# Patient Record
Sex: Female | Born: 1935 | Race: White | Hispanic: No | Smoking: Never smoker
Health system: Southern US, Community
[De-identification: ages and names within clinical notes are randomized; demographics above are authoritative.]

## PROBLEM LIST (undated history)

## (undated) DIAGNOSIS — F419 Anxiety disorder, unspecified: Secondary | ICD-10-CM

## (undated) DIAGNOSIS — F329 Major depressive disorder, single episode, unspecified: Secondary | ICD-10-CM

## (undated) DIAGNOSIS — Z87448 Personal history of other diseases of urinary system: Secondary | ICD-10-CM

## (undated) DIAGNOSIS — K635 Polyp of colon: Secondary | ICD-10-CM

## (undated) DIAGNOSIS — Z974 Presence of external hearing-aid: Secondary | ICD-10-CM

## (undated) DIAGNOSIS — M19041 Primary osteoarthritis, right hand: Secondary | ICD-10-CM

## (undated) DIAGNOSIS — K219 Gastro-esophageal reflux disease without esophagitis: Secondary | ICD-10-CM

## (undated) DIAGNOSIS — E785 Hyperlipidemia, unspecified: Secondary | ICD-10-CM

## (undated) DIAGNOSIS — M810 Age-related osteoporosis without current pathological fracture: Secondary | ICD-10-CM

## (undated) DIAGNOSIS — N189 Chronic kidney disease, unspecified: Secondary | ICD-10-CM

## (undated) DIAGNOSIS — Z66 Do not resuscitate: Secondary | ICD-10-CM

## (undated) DIAGNOSIS — D649 Anemia, unspecified: Secondary | ICD-10-CM

## (undated) DIAGNOSIS — N3281 Overactive bladder: Secondary | ICD-10-CM

## (undated) DIAGNOSIS — M1611 Unilateral primary osteoarthritis, right hip: Secondary | ICD-10-CM

## (undated) DIAGNOSIS — R32 Unspecified urinary incontinence: Secondary | ICD-10-CM

## (undated) DIAGNOSIS — N952 Postmenopausal atrophic vaginitis: Secondary | ICD-10-CM

## (undated) DIAGNOSIS — M8580 Other specified disorders of bone density and structure, unspecified site: Secondary | ICD-10-CM

## (undated) DIAGNOSIS — F32A Depression, unspecified: Secondary | ICD-10-CM

## (undated) DIAGNOSIS — M19042 Primary osteoarthritis, left hand: Secondary | ICD-10-CM

## (undated) DIAGNOSIS — H25019 Cortical age-related cataract, unspecified eye: Secondary | ICD-10-CM

## (undated) DIAGNOSIS — K5900 Constipation, unspecified: Secondary | ICD-10-CM

## (undated) DIAGNOSIS — N393 Stress incontinence (female) (male): Secondary | ICD-10-CM

## (undated) HISTORY — DX: Major depressive disorder, single episode, unspecified: F32.9

## (undated) HISTORY — DX: Unspecified urinary incontinence: R32

## (undated) HISTORY — DX: Overactive bladder: N32.81

## (undated) HISTORY — PX: COLONOSCOPY: SHX174

## (undated) HISTORY — DX: Constipation, unspecified: K59.00

## (undated) HISTORY — DX: Cortical age-related cataract, unspecified eye: H25.019

## (undated) HISTORY — PX: FLEXIBLE SIGMOIDOSCOPY: SHX1649

## (undated) HISTORY — DX: Hyperlipidemia, unspecified: E78.5

## (undated) HISTORY — DX: Anemia, unspecified: D64.9

## (undated) HISTORY — DX: Depression, unspecified: F32.A

## (undated) HISTORY — DX: Personal history of other diseases of urinary system: Z87.448

## (undated) HISTORY — DX: Gastro-esophageal reflux disease without esophagitis: K21.9

## (undated) HISTORY — DX: Stress incontinence (female) (male): N39.3

## (undated) HISTORY — DX: Anxiety disorder, unspecified: F41.9

## (undated) HISTORY — DX: Primary osteoarthritis, left hand: M19.042

## (undated) HISTORY — DX: Other specified disorders of bone density and structure, unspecified site: M85.80

## (undated) HISTORY — DX: Polyp of colon: K63.5

## (undated) HISTORY — DX: Postmenopausal atrophic vaginitis: N95.2

## (undated) HISTORY — PX: CATARACT EXTRACTION: SUR2

## (undated) HISTORY — DX: Primary osteoarthritis, right hand: M19.041

## (undated) HISTORY — DX: Chronic kidney disease, unspecified: N18.9

## (undated) HISTORY — PX: ESOPHAGOGASTRODUODENOSCOPY: SHX1529

## (undated) HISTORY — PX: TONSILLECTOMY: SUR1361

## (undated) HISTORY — DX: Age-related osteoporosis without current pathological fracture: M81.0

## (undated) HISTORY — DX: Hemochromatosis, unspecified: E83.119

---

## 2004-07-01 ENCOUNTER — Ambulatory Visit: Payer: Self-pay | Admitting: Oncology

## 2004-08-01 ENCOUNTER — Ambulatory Visit: Payer: Self-pay | Admitting: Oncology

## 2004-09-08 ENCOUNTER — Ambulatory Visit: Payer: Self-pay | Admitting: Oncology

## 2004-09-20 ENCOUNTER — Ambulatory Visit: Payer: Self-pay | Admitting: Unknown Physician Specialty

## 2004-10-01 ENCOUNTER — Ambulatory Visit: Payer: Self-pay | Admitting: Oncology

## 2004-11-01 ENCOUNTER — Ambulatory Visit: Payer: Self-pay | Admitting: Oncology

## 2004-11-29 ENCOUNTER — Ambulatory Visit: Payer: Self-pay | Admitting: Oncology

## 2005-01-08 ENCOUNTER — Ambulatory Visit: Payer: Self-pay | Admitting: Oncology

## 2005-01-29 ENCOUNTER — Ambulatory Visit: Payer: Self-pay | Admitting: Oncology

## 2005-03-05 ENCOUNTER — Ambulatory Visit: Payer: Self-pay | Admitting: Oncology

## 2005-03-31 ENCOUNTER — Ambulatory Visit: Payer: Self-pay | Admitting: Oncology

## 2005-06-27 ENCOUNTER — Ambulatory Visit: Payer: Self-pay | Admitting: Oncology

## 2005-07-01 ENCOUNTER — Ambulatory Visit: Payer: Self-pay | Admitting: Oncology

## 2005-08-01 ENCOUNTER — Ambulatory Visit: Payer: Self-pay | Admitting: Oncology

## 2005-09-04 ENCOUNTER — Ambulatory Visit: Payer: Self-pay | Admitting: Oncology

## 2005-09-27 ENCOUNTER — Ambulatory Visit: Payer: Self-pay | Admitting: Unknown Physician Specialty

## 2005-10-01 ENCOUNTER — Ambulatory Visit: Payer: Self-pay | Admitting: Oncology

## 2005-11-01 ENCOUNTER — Ambulatory Visit: Payer: Self-pay | Admitting: Oncology

## 2005-12-05 ENCOUNTER — Ambulatory Visit: Payer: Self-pay | Admitting: Oncology

## 2005-12-30 ENCOUNTER — Ambulatory Visit: Payer: Self-pay | Admitting: Oncology

## 2006-01-29 ENCOUNTER — Ambulatory Visit: Payer: Self-pay | Admitting: Oncology

## 2006-03-12 ENCOUNTER — Ambulatory Visit: Payer: Self-pay | Admitting: Oncology

## 2006-04-01 ENCOUNTER — Ambulatory Visit: Payer: Self-pay | Admitting: Surgery

## 2006-04-02 ENCOUNTER — Ambulatory Visit: Payer: Self-pay | Admitting: Oncology

## 2006-05-01 ENCOUNTER — Ambulatory Visit: Payer: Self-pay | Admitting: Oncology

## 2006-06-01 ENCOUNTER — Ambulatory Visit: Payer: Self-pay | Admitting: Oncology

## 2006-07-01 ENCOUNTER — Ambulatory Visit: Payer: Self-pay | Admitting: Oncology

## 2006-08-02 ENCOUNTER — Ambulatory Visit: Payer: Self-pay | Admitting: Oncology

## 2006-09-30 ENCOUNTER — Ambulatory Visit: Payer: Self-pay | Admitting: Unknown Physician Specialty

## 2006-09-30 ENCOUNTER — Ambulatory Visit: Payer: Self-pay | Admitting: Oncology

## 2006-10-01 ENCOUNTER — Ambulatory Visit: Payer: Self-pay | Admitting: Oncology

## 2006-11-01 ENCOUNTER — Ambulatory Visit: Payer: Self-pay | Admitting: Oncology

## 2006-11-30 ENCOUNTER — Ambulatory Visit: Payer: Self-pay | Admitting: Oncology

## 2006-12-31 ENCOUNTER — Ambulatory Visit: Payer: Self-pay | Admitting: Oncology

## 2007-01-30 ENCOUNTER — Ambulatory Visit: Payer: Self-pay | Admitting: Oncology

## 2007-03-02 ENCOUNTER — Ambulatory Visit: Payer: Self-pay | Admitting: Oncology

## 2007-04-01 ENCOUNTER — Ambulatory Visit: Payer: Self-pay | Admitting: Oncology

## 2007-05-02 ENCOUNTER — Ambulatory Visit: Payer: Self-pay | Admitting: Oncology

## 2007-05-14 ENCOUNTER — Ambulatory Visit: Payer: Self-pay | Admitting: Oncology

## 2007-05-20 ENCOUNTER — Ambulatory Visit: Payer: Self-pay | Admitting: Ophthalmology

## 2007-06-02 ENCOUNTER — Ambulatory Visit: Payer: Self-pay | Admitting: Oncology

## 2007-07-02 ENCOUNTER — Ambulatory Visit: Payer: Self-pay | Admitting: Oncology

## 2007-07-07 ENCOUNTER — Ambulatory Visit: Payer: Self-pay | Admitting: Oncology

## 2007-07-08 ENCOUNTER — Ambulatory Visit: Payer: Self-pay | Admitting: Ophthalmology

## 2007-08-02 ENCOUNTER — Ambulatory Visit: Payer: Self-pay | Admitting: Oncology

## 2007-09-01 ENCOUNTER — Ambulatory Visit: Payer: Self-pay | Admitting: Oncology

## 2007-10-02 ENCOUNTER — Ambulatory Visit: Payer: Self-pay | Admitting: Oncology

## 2007-10-21 ENCOUNTER — Ambulatory Visit: Payer: Self-pay | Admitting: Unknown Physician Specialty

## 2007-10-24 ENCOUNTER — Ambulatory Visit: Payer: Self-pay | Admitting: Oncology

## 2007-11-02 ENCOUNTER — Ambulatory Visit: Payer: Self-pay | Admitting: Internal Medicine

## 2007-11-02 ENCOUNTER — Ambulatory Visit: Payer: Self-pay | Admitting: Oncology

## 2007-11-30 ENCOUNTER — Ambulatory Visit: Payer: Self-pay | Admitting: Internal Medicine

## 2007-11-30 ENCOUNTER — Ambulatory Visit: Payer: Self-pay | Admitting: Oncology

## 2007-12-31 ENCOUNTER — Ambulatory Visit: Payer: Self-pay | Admitting: Internal Medicine

## 2008-01-30 ENCOUNTER — Ambulatory Visit: Payer: Self-pay | Admitting: Internal Medicine

## 2008-03-01 ENCOUNTER — Ambulatory Visit: Payer: Self-pay | Admitting: Internal Medicine

## 2008-03-31 ENCOUNTER — Ambulatory Visit: Payer: Self-pay | Admitting: Internal Medicine

## 2008-05-01 ENCOUNTER — Ambulatory Visit: Payer: Self-pay | Admitting: Internal Medicine

## 2008-05-04 ENCOUNTER — Ambulatory Visit: Payer: Self-pay | Admitting: Internal Medicine

## 2008-06-01 ENCOUNTER — Ambulatory Visit: Payer: Self-pay | Admitting: Internal Medicine

## 2008-07-01 ENCOUNTER — Ambulatory Visit: Payer: Self-pay | Admitting: Internal Medicine

## 2008-08-01 ENCOUNTER — Ambulatory Visit: Payer: Self-pay | Admitting: Internal Medicine

## 2008-08-31 ENCOUNTER — Ambulatory Visit: Payer: Self-pay | Admitting: Internal Medicine

## 2008-09-07 ENCOUNTER — Ambulatory Visit: Payer: Self-pay | Admitting: Internal Medicine

## 2008-10-01 ENCOUNTER — Ambulatory Visit: Payer: Self-pay | Admitting: Internal Medicine

## 2008-10-21 ENCOUNTER — Ambulatory Visit: Payer: Self-pay | Admitting: Internal Medicine

## 2008-11-01 ENCOUNTER — Ambulatory Visit: Payer: Self-pay | Admitting: Internal Medicine

## 2008-11-30 ENCOUNTER — Ambulatory Visit: Payer: Self-pay | Admitting: Internal Medicine

## 2008-12-02 ENCOUNTER — Ambulatory Visit: Payer: Self-pay | Admitting: Internal Medicine

## 2008-12-30 ENCOUNTER — Ambulatory Visit: Payer: Self-pay | Admitting: Internal Medicine

## 2009-01-29 ENCOUNTER — Ambulatory Visit: Payer: Self-pay | Admitting: Internal Medicine

## 2009-02-08 ENCOUNTER — Ambulatory Visit: Payer: Self-pay | Admitting: Unknown Physician Specialty

## 2009-02-24 ENCOUNTER — Ambulatory Visit: Payer: Self-pay | Admitting: Internal Medicine

## 2009-03-01 ENCOUNTER — Ambulatory Visit: Payer: Self-pay | Admitting: Internal Medicine

## 2009-05-01 ENCOUNTER — Ambulatory Visit: Payer: Self-pay | Admitting: Internal Medicine

## 2009-05-18 ENCOUNTER — Ambulatory Visit: Payer: Self-pay | Admitting: Internal Medicine

## 2009-06-01 ENCOUNTER — Ambulatory Visit: Payer: Self-pay | Admitting: Internal Medicine

## 2009-08-01 ENCOUNTER — Ambulatory Visit: Payer: Self-pay | Admitting: Internal Medicine

## 2009-08-08 ENCOUNTER — Ambulatory Visit: Payer: Self-pay | Admitting: Internal Medicine

## 2009-08-31 ENCOUNTER — Ambulatory Visit: Payer: Self-pay | Admitting: Internal Medicine

## 2009-10-25 ENCOUNTER — Ambulatory Visit: Payer: Self-pay | Admitting: Unknown Physician Specialty

## 2009-10-31 ENCOUNTER — Ambulatory Visit: Payer: Self-pay | Admitting: Internal Medicine

## 2009-11-01 ENCOUNTER — Ambulatory Visit: Payer: Self-pay | Admitting: Internal Medicine

## 2009-11-29 ENCOUNTER — Ambulatory Visit: Payer: Self-pay | Admitting: Internal Medicine

## 2009-12-30 ENCOUNTER — Ambulatory Visit: Payer: Self-pay | Admitting: Internal Medicine

## 2010-01-23 ENCOUNTER — Ambulatory Visit: Payer: Self-pay | Admitting: Internal Medicine

## 2010-01-29 ENCOUNTER — Ambulatory Visit: Payer: Self-pay | Admitting: Internal Medicine

## 2010-03-31 ENCOUNTER — Ambulatory Visit: Payer: Self-pay | Admitting: Internal Medicine

## 2010-04-17 ENCOUNTER — Ambulatory Visit: Payer: Self-pay | Admitting: Internal Medicine

## 2010-05-01 ENCOUNTER — Ambulatory Visit: Payer: Self-pay | Admitting: Internal Medicine

## 2010-06-22 ENCOUNTER — Ambulatory Visit: Payer: Self-pay | Admitting: Dermatology

## 2010-07-28 ENCOUNTER — Ambulatory Visit: Payer: Self-pay | Admitting: Internal Medicine

## 2010-08-01 ENCOUNTER — Ambulatory Visit: Payer: Self-pay | Admitting: Internal Medicine

## 2010-08-31 ENCOUNTER — Ambulatory Visit: Payer: Self-pay | Admitting: Internal Medicine

## 2010-10-23 ENCOUNTER — Ambulatory Visit: Payer: Self-pay | Admitting: Internal Medicine

## 2010-10-27 ENCOUNTER — Ambulatory Visit: Payer: Self-pay | Admitting: Unknown Physician Specialty

## 2010-11-01 ENCOUNTER — Ambulatory Visit: Payer: Self-pay | Admitting: Internal Medicine

## 2010-11-29 ENCOUNTER — Encounter: Payer: Self-pay | Admitting: Family Medicine

## 2010-11-29 ENCOUNTER — Ambulatory Visit (INDEPENDENT_AMBULATORY_CARE_PROVIDER_SITE_OTHER): Payer: Self-pay | Admitting: Family Medicine

## 2010-11-29 DIAGNOSIS — M810 Age-related osteoporosis without current pathological fracture: Secondary | ICD-10-CM | POA: Insufficient documentation

## 2010-11-29 DIAGNOSIS — Z862 Personal history of diseases of the blood and blood-forming organs and certain disorders involving the immune mechanism: Secondary | ICD-10-CM | POA: Insufficient documentation

## 2010-11-29 DIAGNOSIS — J309 Allergic rhinitis, unspecified: Secondary | ICD-10-CM

## 2010-12-07 NOTE — Assessment & Plan Note (Signed)
Summary: EAR FILL STUFFY   Vital Signs:  Patient Profile:   75 Years Old Female CC:      ears feel full O2 Sat:      99 % O2 treatment:    Room Air Temp:     97.7 degrees F oral Pulse rate:   68 / minute Pulse rhythm:   regular Resp:     12 per minute BP sitting:   135 / 77  (right arm)  Pt. in pain?   no                   Current Allergies (reviewed today): ! MACROBIDHistory of Present Illness History from: patient Chief Complaint: ears feel full History of Present Illness: The patient presented today with 3 to 5 days of a sensation of a fullness in the ears.  Nasal congestion reported.  No fever or chills.  No n/v/d.  NO headaches but has been concerned about slightly decreased hearing.  Pt already wears bilateral hearing aids.  Minimal rare sneezing reported.   REVIEW OF SYSTEMS Constitutional Symptoms      Denies fever, chills, night sweats, weight loss, weight gain, and fatigue.  Eyes       Denies change in vision, eye pain, eye discharge, glasses, contact lenses, and eye surgery. Ear/Nose/Throat/Mouth       Complains of change in hearing.      Denies hearing loss/aids, ear pain, ear discharge, dizziness, frequent runny nose, frequent nose bleeds, sinus problems, sore throat, hoarseness, and tooth pain or bleeding.  Respiratory       Denies dry cough, productive cough, wheezing, shortness of breath, asthma, bronchitis, and emphysema/COPD.  Cardiovascular       Denies murmurs, chest pain, and tires easily with exhertion.    Gastrointestinal       Denies stomach pain, nausea/vomiting, diarrhea, constipation, blood in bowel movements, and indigestion. Genitourniary       Denies painful urination, kidney stones, and loss of urinary control. Neurological       Denies paralysis, seizures, and fainting/blackouts. Musculoskeletal       Denies muscle pain, joint pain, joint stiffness, decreased range of motion, redness, swelling, muscle weakness, and gout.  Skin  Denies bruising, unusual mles/lumps or sores, and hair/skin or nail changes.  Psych       Denies mood changes, temper/anger issues, anxiety/stress, speech problems, depression, and sleep problems.  Past History:  Family History: Last updated: 11/29/2010 Healthy per patient  Social History: Last updated: 11/29/2010 Wine with dinner No Tobacco, No Recreational Drugs Retired  Past Medical History: Hemochromatosis Osteoporosis Allergic Rhinitis  Past Surgical History: Denies surgical history  Family History: Healthy per patient  Social History: Wine with dinner No Tobacco, No Recreational Drugs Retired Physical Exam General appearance: well developed, well nourished, no acute distress Head: normocephalic, atraumatic Eyes: conjunctivae and lids normal Pupils: equal, round, reactive to light Ears: normal, no lesions or deformities Nasal: pale and boggy swollen nasal mucosa Oral/Pharynx: tongue normal, posterior pharynx without erythema or exudate Neck: neck supple,  trachea midline, no masses Heart: regular rate and  rhythm, no murmur Extremities: normal extremities Neurological: grossly intact and non-focal Skin: no obvious rashes or lesions MSE: oriented to time, place, and person Assessment New Problems: ALLERGIC RHINITIS CAUSE UNSPECIFIED (ICD-477.9) UNSPECIFIED OSTEOPOROSIS (ICD-733.00) HEMOCHROMATOSIS, HX OF (ICD-V12.3)   Patient Education: Patient and/or caregiver instructed in the following: rest. The risks, benefits and possible side effects were clearly explained and discussed with the patient.  The patient verbalized clear understanding.  The patient was given instructions to return if symptoms don't improve, worsen or new changes develop.  If it is not during clinic hours and the patient cannot get back to this clinic then the patient was told to seek medical care at an available urgent care or emergency department.  The patient verbalized understanding.     Demonstrates willingness to comply.  Plan Follow Up: Follow up on an as needed basis, Follow up with Primary Physician  The patient and/or caregiver has been counseled thoroughly with regard to medications prescribed including dosage, schedule, interactions, rationale for use, and possible side effects and they verbalize understanding.  Diagnoses and expected course of recovery discussed and will return if not improved as expected or if the condition worsens. Patient and/or caregiver verbalized understanding.   Patient Instructions: 1)  The patient was informed that there is no on-call provider or services available at this clinic during off-hours (when the clinic is closed).  If the patient developed a problem or concern that required immediate attention, the patient was advised to go the the nearest available urgent care or emergency department for medical care.  The patient verbalized understanding.    2)  Return or go to the ER if no improvement or symptoms getting worse.

## 2010-12-16 ENCOUNTER — Encounter: Payer: Self-pay | Admitting: Family Medicine

## 2010-12-19 NOTE — Letter (Signed)
Summary: medication list  medication list   Imported By: Rosine Beat 12/16/2010 16:45:42  _____________________________________________________________________  External Attachment:    Type:   Image     Comment:   External Document

## 2010-12-21 ENCOUNTER — Ambulatory Visit (INDEPENDENT_AMBULATORY_CARE_PROVIDER_SITE_OTHER): Payer: Medicare Other | Admitting: Family Medicine

## 2010-12-21 ENCOUNTER — Encounter: Payer: Self-pay | Admitting: Family Medicine

## 2010-12-21 DIAGNOSIS — J309 Allergic rhinitis, unspecified: Secondary | ICD-10-CM

## 2010-12-21 DIAGNOSIS — J019 Acute sinusitis, unspecified: Secondary | ICD-10-CM

## 2010-12-28 NOTE — Miscellaneous (Signed)
Summary: Written Rxs for 12/21/10  Clinical Lists Changes  Medications: Added new medication of CETIRIZINE HCL 10 MG TABS (CETIRIZINE HCL) take 1 by mouth daily for allergies - Signed Added new medication of AZITHROMYCIN 250 MG TABS (AZITHROMYCIN) take as directed - Signed Rx of CETIRIZINE HCL 10 MG TABS (CETIRIZINE HCL) take 1 by mouth daily for allergies;  #30 x 1;  Signed;  Entered by: Standley Dakins MD;  Authorized by: Standley Dakins MD;  Method used: Handwritten Rx of AZITHROMYCIN 250 MG TABS (AZITHROMYCIN) take as directed;  #6 x 0;  Signed;  Entered by: Standley Dakins MD;  Authorized by: Standley Dakins MD;  Method used: Handwritten    Prescriptions: AZITHROMYCIN 250 MG TABS (AZITHROMYCIN) take as directed  #6 x 0   Entered and Authorized by:   Standley Dakins MD   Signed by:   Standley Dakins MD on 12/21/2010   Method used:   Handwritten   RxID:   [id number] CETIRIZINE HCL 10 MG TABS (CETIRIZINE HCL) take 1 by mouth daily for allergies  #30 x 1   Entered and Authorized by:   Standley Dakins MD   Signed by:   Standley Dakins MD on 12/21/2010   Method used:   Handwritten   RxID:   [id number]  I called the pharmacist to check about patient taking azithromycin.  They said they had in her system that she had no allergies.  I told them that she may have reaction to macrobid.  I called patient and she said that she had some itching in the eyes when she took macrobid a long time ago.  I told her that if she had any recurrent symptoms on the zithromax to please stop the medicine and to let us know and we would need to change the medication.  I told patient to go to ER if she had any swelling of the lips, face, etc.  The patient verbalized clear understanding.  Rodney Langton, MD, CDE, [facility]

## 2010-12-28 NOTE — Assessment & Plan Note (Signed)
Summary: ALLERGIES   Vital Signs:  Patient Profile:   75 Years Old Female CC:      Sinus Drainage O2 Sat:      99 % O2 treatment:    Room Air Temp:     97.9 degrees F oral Pulse rate:   70 / minute Pulse rhythm:   regular Resp:     13 per minute BP sitting:   141 / 84  (left arm)  Pt. in pain?   no                   Current Allergies (reviewed today): ! MACROBIDHistory of Present Illness History from: patient Chief Complaint: Sinus Drainage History of Present Illness: The patient reported that she is still having a lot of sinus postnasal drainage and dental pain and coughing up yellow mucus.  She is having some thick yellow drainage from the nose as well.  She had stopped using flonase nasal spray after about 1 week.  She is using some OTC decongestant with acetaminophen now but still having some symptoms especially after spending time outside among the blooming cherry trees, etc.  She was concerned about a possible sinus infection.  No fever or chills reported.  No n/v/d.  No SOB.  Occasional cough only.    REVIEW OF SYSTEMS Constitutional Symptoms      Denies fever, chills, night sweats, weight loss, weight gain, and fatigue.  Eyes       Denies change in vision, eye pain, eye discharge, glasses, contact lenses, and eye surgery. Ear/Nose/Throat/Mouth       Complains of frequent runny nose, sinus problems, and tooth pain or bleeding.      Denies hearing loss/aids, change in hearing, ear pain, ear discharge, dizziness, frequent nose bleeds, sore throat, and hoarseness.  Respiratory       Denies dry cough, productive cough, wheezing, shortness of breath, asthma, bronchitis, and emphysema/COPD.  Cardiovascular       Denies murmurs, chest pain, and tires easily with exhertion.    Gastrointestinal       Denies stomach pain, nausea/vomiting, diarrhea, constipation, blood in bowel movements, and indigestion. Genitourniary       Denies painful urination, kidney stones, and loss of  urinary control. Neurological       Denies paralysis, seizures, and fainting/blackouts. Musculoskeletal       Denies muscle pain, joint pain, joint stiffness, decreased range of motion, redness, swelling, muscle weakness, and gout.  Skin       Denies bruising, unusual mles/lumps or sores, and hair/skin or nail changes.  Psych       Denies mood changes, temper/anger issues, anxiety/stress, speech problems, depression, and sleep problems.  Past History:  Family History: Last updated: 11/29/2010 Healthy per patient  Social History: Last updated: 11/29/2010 Wine with dinner No Tobacco, No Recreational Drugs Retired  Past Medical History: Reviewed history from 11/29/2010 and no changes required. Hemochromatosis Osteoporosis Allergic Rhinitis  Past Surgical History: Reviewed history from 11/29/2010 and no changes required. Denies surgical history  Family History: Reviewed history from 11/29/2010 and no changes required. Healthy per patient  Social History: Reviewed history from 11/29/2010 and no changes required. Wine with dinner No Tobacco, No Recreational Drugs Retired Physical Exam General appearance: well developed, well nourished, no acute distress Head: normocephalic, atraumatic Eyes: conjunctivae and lids normal Pupils: equal, round, reactive to light Ears: normal, no lesions or deformities Nasal: swollen red turbinates with congestion Oral/Pharynx: tongue normal, posterior pharynx without erythema or exudate  Neck: neck supple,  trachea midline, no masses Chest/Lungs: no rales, wheezes, or rhonchi bilateral, breath sounds equal without effort Heart: regular rate and  rhythm, no murmur Extremities: normal extremities Neurological: grossly intact and non-focal Skin: no obvious rashes or lesions MSE: oriented to time, place, and person Assessment New Problems: ACUTE SINUSITIS, UNSPECIFIED (ICD-461.9)   Patient Education: Patient and/or caregiver instructed  in the following: rest. The risks, benefits and possible side effects were clearly explained and discussed with the patient.  The patient verbalized clear understanding.  The patient was given instructions to return if symptoms don't improve, worsen or new changes develop.  If it is not during clinic hours and the patient cannot get back to this clinic then the patient was told to seek medical care at an available urgent care or emergency department.  The patient verbalized understanding.   Demonstrates willingness to comply.  Plan Follow Up: Follow up in 2-3 days if no improvement, Follow up on an as needed basis, Follow up with Primary Physician  The patient and/or caregiver has been counseled thoroughly with regard to medications prescribed including dosage, schedule, interactions, rationale for use, and possible side effects and they verbalize understanding.  Diagnoses and expected course of recovery discussed and will return if not improved as expected or if the condition worsens. Patient and/or caregiver verbalized understanding.   Patient Instructions: 1)  Go to the pharmacy and pick up your prescription (s).  2)  Take your antibiotic as prescribed until ALL of it is gone, but stop if you develop a rash or swelling and contact our office as soon as possible. 3)  Acute sinusitis symptoms for less than 10 days are not helped by antibiotics.Use warm moist compresses, and over the counter decongestants ( only as directed). Call if no improvement in 5-7 days, sooner if increasing pain, fever, or new symptoms. 4)  Start Zyrtec (Cetirizine) daily to alleviate allergy symptoms.  Take 1 at night.  You can use this along with flonase nasal spray.  5)  Return or go to the ER if no improvement or symptoms getting worse.

## 2011-01-02 NOTE — Letter (Signed)
Summary: History Form  History Form   Imported By: Eugenio Hoes 12/25/2010 10:09:18  _____________________________________________________________________  External Attachment:    Type:   Image     Comment:   External Document

## 2011-01-12 ENCOUNTER — Ambulatory Visit: Payer: Self-pay | Admitting: Internal Medicine

## 2011-01-30 ENCOUNTER — Ambulatory Visit: Payer: Self-pay | Admitting: Internal Medicine

## 2011-04-10 ENCOUNTER — Ambulatory Visit: Payer: Self-pay | Admitting: Internal Medicine

## 2011-05-02 ENCOUNTER — Ambulatory Visit: Payer: Self-pay | Admitting: Internal Medicine

## 2011-07-02 ENCOUNTER — Ambulatory Visit: Payer: Self-pay | Admitting: Internal Medicine

## 2011-08-02 ENCOUNTER — Ambulatory Visit: Payer: Self-pay | Admitting: Internal Medicine

## 2011-09-26 ENCOUNTER — Ambulatory Visit: Payer: Self-pay | Admitting: Internal Medicine

## 2011-10-02 ENCOUNTER — Ambulatory Visit: Payer: Self-pay | Admitting: Internal Medicine

## 2011-11-08 ENCOUNTER — Ambulatory Visit: Payer: Self-pay | Admitting: Unknown Physician Specialty

## 2011-12-17 ENCOUNTER — Ambulatory Visit: Payer: Self-pay | Admitting: Oncology

## 2011-12-17 LAB — CBC CANCER CENTER
Basophil #: 0.1 x10 3/mm (ref 0.0–0.1)
Basophil %: 0.9 %
Eosinophil %: 0.6 %
HGB: 13.2 g/dL (ref 12.0–16.0)
Lymphocyte #: 1.4 x10 3/mm (ref 1.0–3.6)
Lymphocyte %: 18.8 %
MCH: 32.7 pg (ref 26.0–34.0)
MCHC: 34.1 g/dL (ref 32.0–36.0)
Monocyte %: 6.5 %
Neutrophil #: 5.5 x10 3/mm (ref 1.4–6.5)
Platelet: 362 x10 3/mm (ref 150–440)
RBC: 4.05 10*6/uL (ref 3.80–5.20)
RDW: 13.1 % (ref 11.5–14.5)

## 2011-12-17 LAB — FERRITIN: Ferritin (ARMC): 99 ng/mL (ref 8–388)

## 2011-12-31 ENCOUNTER — Ambulatory Visit: Payer: Self-pay | Admitting: Oncology

## 2012-03-18 ENCOUNTER — Ambulatory Visit: Payer: Self-pay | Admitting: Oncology

## 2012-03-18 LAB — CBC CANCER CENTER
Basophil #: 0 x10 3/mm (ref 0.0–0.1)
Basophil %: 0.5 %
Eosinophil #: 0.1 x10 3/mm (ref 0.0–0.7)
Eosinophil %: 1 %
Lymphocyte #: 1.4 x10 3/mm (ref 1.0–3.6)
MCH: 32.4 pg (ref 26.0–34.0)
MCHC: 33.1 g/dL (ref 32.0–36.0)
MCV: 98 fL (ref 80–100)
Monocyte #: 0.5 x10 3/mm (ref 0.2–0.9)
Monocyte %: 8.8 %
Neutrophil %: 66.7 %
WBC: 6.2 x10 3/mm (ref 3.6–11.0)

## 2012-03-18 LAB — FERRITIN: Ferritin (ARMC): 99 ng/mL (ref 8–388)

## 2012-03-31 ENCOUNTER — Ambulatory Visit: Payer: Self-pay | Admitting: Oncology

## 2012-06-16 ENCOUNTER — Ambulatory Visit: Payer: Self-pay | Admitting: Oncology

## 2012-06-16 LAB — CBC CANCER CENTER
Basophil #: 0 x10 3/mm (ref 0.0–0.1)
Basophil %: 0.5 %
Eosinophil %: 1.1 %
HCT: 37.9 % (ref 35.0–47.0)
MCH: 32.2 pg (ref 26.0–34.0)
MCHC: 32.8 g/dL (ref 32.0–36.0)
MCV: 98 fL (ref 80–100)
Monocyte %: 7.5 %
Neutrophil #: 4.3 x10 3/mm (ref 1.4–6.5)
Platelet: 294 x10 3/mm (ref 150–440)
RDW: 12.6 % (ref 11.5–14.5)
WBC: 6.3 x10 3/mm (ref 3.6–11.0)

## 2012-06-16 LAB — IRON AND TIBC
Iron Bind.Cap.(Total): 215 ug/dL — ABNORMAL LOW (ref 250–450)
Iron: 162 ug/dL (ref 50–170)
Unbound Iron-Bind.Cap.: 53 ug/dL

## 2012-07-01 ENCOUNTER — Ambulatory Visit: Payer: Self-pay | Admitting: Oncology

## 2012-09-15 ENCOUNTER — Ambulatory Visit: Payer: Self-pay | Admitting: Oncology

## 2012-09-15 LAB — CBC CANCER CENTER
Basophil #: 0 x10 3/mm (ref 0.0–0.1)
Eosinophil %: 3.3 %
HGB: 12.8 g/dL (ref 12.0–16.0)
Lymphocyte #: 1.5 x10 3/mm (ref 1.0–3.6)
MCH: 33.2 pg (ref 26.0–34.0)
MCV: 96 fL (ref 80–100)
Monocyte #: 0.6 x10 3/mm (ref 0.2–0.9)
Monocyte %: 9.1 %
Neutrophil %: 62.4 %
Platelet: 314 x10 3/mm (ref 150–440)
RBC: 3.87 10*6/uL (ref 3.80–5.20)
RDW: 13 % (ref 11.5–14.5)
WBC: 6.1 x10 3/mm (ref 3.6–11.0)

## 2012-09-15 LAB — IRON AND TIBC
Iron Bind.Cap.(Total): 246 ug/dL — ABNORMAL LOW (ref 250–450)
Iron Saturation: 67 %
Iron: 164 ug/dL (ref 50–170)

## 2012-09-15 LAB — FERRITIN: Ferritin (ARMC): 95 ng/mL (ref 8–388)

## 2012-10-01 ENCOUNTER — Ambulatory Visit: Payer: Self-pay | Admitting: Oncology

## 2012-12-10 ENCOUNTER — Ambulatory Visit: Payer: Self-pay | Admitting: Unknown Physician Specialty

## 2012-12-11 ENCOUNTER — Ambulatory Visit: Payer: Self-pay | Admitting: Oncology

## 2012-12-12 LAB — CBC CANCER CENTER
Basophil #: 0.1 x10 3/mm (ref 0.0–0.1)
Basophil %: 0.7 %
Eosinophil #: 0.1 x10 3/mm (ref 0.0–0.7)
Eosinophil %: 1.3 %
HCT: 41.1 % (ref 35.0–47.0)
HGB: 14 g/dL (ref 12.0–16.0)
Lymphocyte #: 1.8 x10 3/mm (ref 1.0–3.6)
Lymphocyte %: 22.7 %
MCH: 32.6 pg (ref 26.0–34.0)
MCHC: 34.1 g/dL (ref 32.0–36.0)
MCV: 96 fL (ref 80–100)
Monocyte %: 7.3 %
Neutrophil #: 5.3 x10 3/mm (ref 1.4–6.5)
Platelet: 340 x10 3/mm (ref 150–440)
RBC: 4.29 10*6/uL (ref 3.80–5.20)
RDW: 12.2 % (ref 11.5–14.5)

## 2012-12-12 LAB — FERRITIN: Ferritin (ARMC): 99 ng/mL (ref 8–388)

## 2012-12-12 LAB — IRON AND TIBC
Iron: 83 ug/dL (ref 50–170)
Unbound Iron-Bind.Cap.: 173 ug/dL

## 2012-12-30 ENCOUNTER — Ambulatory Visit: Payer: Self-pay | Admitting: Oncology

## 2013-03-01 ENCOUNTER — Ambulatory Visit: Payer: Self-pay | Admitting: Oncology

## 2013-03-23 LAB — CBC CANCER CENTER
HCT: 40 % (ref 35.0–47.0)
MCH: 33.2 pg (ref 26.0–34.0)
MCHC: 34.8 g/dL (ref 32.0–36.0)
Monocyte #: 0.5 x10 3/mm (ref 0.2–0.9)
Monocyte %: 8.8 %
Neutrophil #: 3.7 x10 3/mm (ref 1.4–6.5)
Neutrophil %: 59.9 %
Platelet: 353 x10 3/mm (ref 150–440)
RDW: 12.5 % (ref 11.5–14.5)

## 2013-03-23 LAB — IRON AND TIBC: Iron Bind.Cap.(Total): 261 ug/dL (ref 250–450)

## 2013-03-23 LAB — FERRITIN: Ferritin (ARMC): 94 ng/mL (ref 8–388)

## 2013-03-31 ENCOUNTER — Ambulatory Visit: Payer: Self-pay | Admitting: Oncology

## 2013-07-07 ENCOUNTER — Ambulatory Visit: Payer: Self-pay | Admitting: Oncology

## 2013-07-07 LAB — CBC CANCER CENTER
Basophil #: 0.1 x10 3/mm (ref 0.0–0.1)
Basophil %: 0.8 %
Eosinophil %: 2.4 %
Lymphocyte #: 1.9 x10 3/mm (ref 1.0–3.6)
Lymphocyte %: 28.5 %
MCHC: 34.3 g/dL (ref 32.0–36.0)
MCV: 96 fL (ref 80–100)
Monocyte #: 0.6 x10 3/mm (ref 0.2–0.9)
Monocyte %: 9.1 %
Neutrophil #: 3.9 x10 3/mm (ref 1.4–6.5)
Neutrophil %: 59.2 %
Platelet: 343 x10 3/mm (ref 150–440)
WBC: 6.6 x10 3/mm (ref 3.6–11.0)

## 2013-07-07 LAB — IRON AND TIBC
Iron Bind.Cap.(Total): 252 ug/dL (ref 250–450)
Iron: 132 ug/dL (ref 50–170)

## 2013-07-07 LAB — FERRITIN: Ferritin (ARMC): 107 ng/mL (ref 8–388)

## 2013-08-01 ENCOUNTER — Ambulatory Visit: Payer: Self-pay | Admitting: Oncology

## 2013-10-13 ENCOUNTER — Ambulatory Visit: Payer: Self-pay | Admitting: Oncology

## 2013-10-13 LAB — IRON AND TIBC
IRON SATURATION: 43 %
Iron Bind.Cap.(Total): 247 ug/dL — ABNORMAL LOW (ref 250–450)
Iron: 105 ug/dL (ref 50–170)
UNBOUND IRON-BIND. CAP.: 142 ug/dL

## 2013-10-13 LAB — FERRITIN: FERRITIN (ARMC): 82 ng/mL (ref 8–388)

## 2013-10-13 LAB — CBC CANCER CENTER
Basophil #: 0.1 x10 3/mm (ref 0.0–0.1)
Basophil %: 1 %
Eosinophil #: 0.1 x10 3/mm (ref 0.0–0.7)
Eosinophil %: 1.9 %
HCT: 39.3 % (ref 35.0–47.0)
HGB: 13.1 g/dL (ref 12.0–16.0)
LYMPHS PCT: 26.9 %
Lymphocyte #: 1.8 x10 3/mm (ref 1.0–3.6)
MCH: 32.2 pg (ref 26.0–34.0)
MCHC: 33.2 g/dL (ref 32.0–36.0)
MCV: 97 fL (ref 80–100)
MONO ABS: 0.6 x10 3/mm (ref 0.2–0.9)
Monocyte %: 9.2 %
NEUTROS PCT: 61 %
Neutrophil #: 4.1 x10 3/mm (ref 1.4–6.5)
PLATELETS: 339 x10 3/mm (ref 150–440)
RBC: 4.06 10*6/uL (ref 3.80–5.20)
RDW: 12.7 % (ref 11.5–14.5)
WBC: 6.7 x10 3/mm (ref 3.6–11.0)

## 2013-11-01 ENCOUNTER — Ambulatory Visit: Payer: Self-pay | Admitting: Oncology

## 2014-01-06 ENCOUNTER — Ambulatory Visit: Payer: Self-pay | Admitting: Internal Medicine

## 2014-02-05 ENCOUNTER — Ambulatory Visit: Payer: Self-pay | Admitting: Oncology

## 2014-02-08 LAB — CBC CANCER CENTER
Basophil #: 0 x10 3/mm (ref 0.0–0.1)
Basophil %: 0.6 %
EOS ABS: 0.1 x10 3/mm (ref 0.0–0.7)
Eosinophil %: 1.4 %
HCT: 39.5 % (ref 35.0–47.0)
HGB: 13.4 g/dL (ref 12.0–16.0)
LYMPHS PCT: 30.1 %
Lymphocyte #: 1.5 x10 3/mm (ref 1.0–3.6)
MCH: 32.4 pg (ref 26.0–34.0)
MCHC: 34 g/dL (ref 32.0–36.0)
MCV: 95 fL (ref 80–100)
MONO ABS: 0.5 x10 3/mm (ref 0.2–0.9)
MONOS PCT: 9.9 %
Neutrophil #: 2.9 x10 3/mm (ref 1.4–6.5)
Neutrophil %: 58 %
Platelet: 344 x10 3/mm (ref 150–440)
RBC: 4.15 10*6/uL (ref 3.80–5.20)
RDW: 12.3 % (ref 11.5–14.5)
WBC: 5 x10 3/mm (ref 3.6–11.0)

## 2014-02-08 LAB — IRON AND TIBC
IRON BIND. CAP.(TOTAL): 237 ug/dL — AB (ref 250–450)
IRON SATURATION: 53 %
Iron: 126 ug/dL (ref 50–170)
Unbound Iron-Bind.Cap.: 111 ug/dL

## 2014-02-08 LAB — FERRITIN: FERRITIN (ARMC): 105 ng/mL (ref 8–388)

## 2014-03-01 ENCOUNTER — Ambulatory Visit: Payer: Self-pay | Admitting: Oncology

## 2014-03-12 DIAGNOSIS — M545 Low back pain, unspecified: Secondary | ICD-10-CM | POA: Insufficient documentation

## 2014-06-14 ENCOUNTER — Ambulatory Visit: Payer: Self-pay | Admitting: Oncology

## 2014-06-14 LAB — CBC CANCER CENTER
BASOS PCT: 0.5 %
Basophil #: 0 x10 3/mm (ref 0.0–0.1)
EOS PCT: 0.8 %
Eosinophil #: 0.1 x10 3/mm (ref 0.0–0.7)
HCT: 39.9 % (ref 35.0–47.0)
HGB: 13.4 g/dL (ref 12.0–16.0)
LYMPHS PCT: 20.9 %
Lymphocyte #: 1.4 x10 3/mm (ref 1.0–3.6)
MCH: 32.5 pg (ref 26.0–34.0)
MCHC: 33.7 g/dL (ref 32.0–36.0)
MCV: 97 fL (ref 80–100)
MONO ABS: 0.5 x10 3/mm (ref 0.2–0.9)
Monocyte %: 7.6 %
Neutrophil #: 4.7 x10 3/mm (ref 1.4–6.5)
Neutrophil %: 70.2 %
PLATELETS: 376 x10 3/mm (ref 150–440)
RBC: 4.13 10*6/uL (ref 3.80–5.20)
RDW: 13.9 % (ref 11.5–14.5)
WBC: 6.7 x10 3/mm (ref 3.6–11.0)

## 2014-06-14 LAB — HEPATIC FUNCTION PANEL A (ARMC)
Albumin: 4 g/dL
Alkaline Phosphatase: 83 U/L
Bilirubin, Direct: 0.1 mg/dL
Bilirubin,Total: 0.4 mg/dL
SGOT(AST): 17 U/L
SGPT (ALT): 20 U/L
Total Protein: 7 g/dL

## 2014-06-14 LAB — IRON AND TIBC
IRON BIND. CAP.(TOTAL): 268 ug/dL (ref 250–450)
IRON SATURATION: 50 %
Iron: 135 ug/dL (ref 50–170)
Unbound Iron-Bind.Cap.: 133 ug/dL

## 2014-06-14 LAB — FERRITIN: Ferritin (ARMC): 110 ng/mL

## 2014-07-01 ENCOUNTER — Ambulatory Visit: Payer: Self-pay | Admitting: Oncology

## 2014-08-03 DIAGNOSIS — N3946 Mixed incontinence: Secondary | ICD-10-CM | POA: Insufficient documentation

## 2014-08-04 DIAGNOSIS — F411 Generalized anxiety disorder: Secondary | ICD-10-CM | POA: Insufficient documentation

## 2014-09-13 ENCOUNTER — Ambulatory Visit: Payer: Self-pay | Admitting: Oncology

## 2014-09-13 LAB — IRON AND TIBC
IRON BIND. CAP.(TOTAL): 225 ug/dL — AB (ref 250–450)
IRON: 145 ug/dL (ref 50–170)
Iron Saturation: 64 %
Unbound Iron-Bind.Cap.: 80 ug/dL

## 2014-09-13 LAB — CBC CANCER CENTER
BASOS PCT: 0.5 %
Basophil #: 0 x10 3/mm (ref 0.0–0.1)
EOS PCT: 0.9 %
Eosinophil #: 0 x10 3/mm (ref 0.0–0.7)
HCT: 37 % (ref 35.0–47.0)
HGB: 12.6 g/dL (ref 12.0–16.0)
Lymphocyte #: 1.4 x10 3/mm (ref 1.0–3.6)
Lymphocyte %: 24.8 %
MCH: 32.5 pg (ref 26.0–34.0)
MCHC: 33.9 g/dL (ref 32.0–36.0)
MCV: 96 fL (ref 80–100)
MONOS PCT: 7.6 %
Monocyte #: 0.4 x10 3/mm (ref 0.2–0.9)
NEUTROS PCT: 66.2 %
Neutrophil #: 3.7 x10 3/mm (ref 1.4–6.5)
Platelet: 336 x10 3/mm (ref 150–440)
RBC: 3.87 10*6/uL (ref 3.80–5.20)
RDW: 12.5 % (ref 11.5–14.5)
WBC: 5.5 x10 3/mm (ref 3.6–11.0)

## 2014-09-13 LAB — FERRITIN: Ferritin (ARMC): 113 ng/mL (ref 8–388)

## 2014-10-01 ENCOUNTER — Ambulatory Visit: Payer: Self-pay | Admitting: Oncology

## 2014-12-13 ENCOUNTER — Ambulatory Visit
Admit: 2014-12-13 | Disposition: A | Discharge status: Home / Self Care | Payer: Self-pay | Attending: Oncology | Admitting: Oncology

## 2014-12-31 ENCOUNTER — Ambulatory Visit
Admit: 2014-12-31 | Disposition: A | Discharge status: Home / Self Care | Payer: Self-pay | Attending: Oncology | Admitting: Oncology

## 2015-01-10 ENCOUNTER — Ambulatory Visit
Admit: 2015-01-10 | Disposition: A | Discharge status: Home / Self Care | Payer: Self-pay | Attending: Internal Medicine | Admitting: Internal Medicine

## 2015-01-11 DIAGNOSIS — F419 Anxiety disorder, unspecified: Secondary | ICD-10-CM | POA: Insufficient documentation

## 2015-01-11 DIAGNOSIS — M19049 Primary osteoarthritis, unspecified hand: Secondary | ICD-10-CM | POA: Insufficient documentation

## 2015-01-11 DIAGNOSIS — K5900 Constipation, unspecified: Secondary | ICD-10-CM | POA: Insufficient documentation

## 2015-02-04 DIAGNOSIS — Z86010 Personal history of colonic polyps: Secondary | ICD-10-CM | POA: Insufficient documentation

## 2015-03-03 ENCOUNTER — Ambulatory Visit: Payer: Medicare Other | Attending: Obstetrics and Gynecology | Admitting: Physical Therapy

## 2015-03-03 ENCOUNTER — Encounter: Payer: Self-pay | Admitting: Physical Therapy

## 2015-03-03 DIAGNOSIS — N8189 Other female genital prolapse: Secondary | ICD-10-CM | POA: Diagnosis not present

## 2015-03-03 DIAGNOSIS — R278 Other lack of coordination: Secondary | ICD-10-CM | POA: Diagnosis present

## 2015-03-03 NOTE — Patient Instructions (Signed)
PELVIC FLOOR / KEGEL EXERCISES   Pelvic floor/ Kegel exercises are used to strengthen the muscles in the base of your pelvis that are responsible for supporting your pelvic organs and preventing urine/feces leakage. Based on your therapist's recommendations, they can be performed while standing, sitting, or lying down.  Make yourself aware of this muscle group by using these cues:  Imagine you are in a crowded room and you feel the need to pass gas. Your response is to pull up and in at the rectum.  Close the rectum. Pull the muscles up inside your body,feeling your scrotum lifting as well . Feel the pelvic floor muscles lift as if you were walking into a cold lake.  Place your hand on top of your pubic bone. Tighten and draw in the muscles around the anal muscles without squeezing the buttock muscles.  Common Errors:  Breath holding: If you are holding your breath, you may be bearing down against your bladder instead of pulling it up. If you belly bulges up while you are squeezing, you are holding your breath. Be sure to breathe gently in and out while exercising. Counting out loud may help you avoid holding your breath.  Accessory muscle use: You should not see or feel other muscle movement when performing pelvic floor exercises. When done properly, no one can tell that you are performing the exercises. Keep the buttocks, belly and inner thighs relaxed.  Overdoing it: Your muscles can fatigue and stop working for you if you over-exercise. You may actually leak more or feel soreness at the lower abdomen or rectum.  YOUR HOME EXERCISE PROGRAM  LONG HOLDS: Position: on back  Inhale and then exhale. Then squeeze the muscle and count aloud for 10 seconds. Rest with three long breaths. (Be sure to let belly sink in with exhales and not push outward)  Perform 4 repetitions, 5 times/day    **ALSO SQUEEZE BEFORE YOUR SNEEZE, COUGH, LAUGH to decrease downward pressure   **ALSO EXHALE BEFORE  YOU RISE AGAINST GRAVITY (lifting, sit to stand, from squat to stand)    **Log Rolling

## 2015-03-04 NOTE — Therapy (Addendum)
[place] [facility] SERVICES [address] [place], [place], [address] Phone: [phone]   Fax:  [phone]  Physical Therapy Evaluation  Patient Details  Name: Judith Kim MRN: [id number] Date of Birth: [birth year] Referring Provider:  Roda Shutters, FNP  Encounter Date: 03/03/2015    Past Medical History  Diagnosis Date  . Stress incontinence   . Colon polyp   . Anemia   . Constipation   . Osteoarthritis of both hands   . Osteopenia   . Overactive bladder   . Hemochromatosis   . GERD (gastroesophageal reflux disease)   . H/O hematuria   . Osteoporosis   . Cataract cortical, senile   . Depression   . Chronic kidney disease   . Anxiety   . Incontinence in female   . Vaginal atrophy   . Hyperlipidemia     since 10/2013,  Dr. Candiss Norse has cleared her from meds and reported to her her levels are [facility].     Past Surgical History  Procedure Laterality Date  . Esophagogastroduodenoscopy    . Tonsillectomy    . Cataract extraction    . Colonoscopy    . Flexible sigmoidoscopy    . Colonoscopy with propofol N/A 03/28/2015    Procedure: COLONOSCOPY WITH PROPOFOL;  Surgeon: Manya Silvas, MD;  Location: [facility] ENDOSCOPY;  Service: Endoscopy;  Laterality: N/A;    There were no vitals filed for this visit.  Visit Diagnosis:  Coordination abnormal - Plan: PT plan of care cert/re-cert  Pelvic floor weakness - Plan: PT plan of care cert/re-cert      Subjective Assessment - 03/03/15 1517    Subjective Pt noticed 10 years ago she experienced urianry leakage. Over the past 2-3 years, pt experienced increased leakage with walking. One year ago, pt fell and fractured her pelvis which healed naturally through 77mo of bedrest.  Pt started PT 6 months ago with only 4 sessions. Following her PT, she now follows a routine in her home of strength training  (15 min on treadmill, 15 min  green theraband, 15 min floor exercises: 5 x/ week) . Pt  does not experience leakage with exercises or stairs. Leaking occurs mostly with walking at variable distances. Pt wears overnight urinary pad prior walking. Daily Fluid Intake:  8 (cups) of water, no sodas/tea/ coffee., 1 cup of lemonade 3x week.  Nocturia 3x/night without leakage.  10 days ago, pt experienced severe episode of incontinence as pt prepared for a hike (2hr on levelled ground). Despite having completely emptied her bladder, pt had completely soaked through garments and panty liner due to  inaccessibility to a toilet. Changes in pads/ day: 1-2x/ week.     Pertinent History Hx of 2 vaginal deliveries, denied pain. Fx occured on sacrum where pt notices pressure sensation.    Limitations Other (comment)  walking    Patient Stated Goals decrease incontinence , get off medications for Sx, hiking/ kayaking                                     PT Long Term Goals - 03/22/15 1634    PT LONG TERM GOAL #1   Title Pt will score an increased % from 56.8% on IOQ-L to > 75% in order to improve QOL.  (D/C 03/21/15: 67%)    Time 12   Period Weeks   Status Partially Met  PT LONG TERM GOAL #2   Title Pt will demo decreased abdominal separation from 3 fingers width below umbilicus to < 2 fingers width in order to lift and carry kayak with minimized risk for injuries.    Time 12   Period Weeks   Status Unable to assess   PT LONG TERM GOAL #3   Title Pt will demo increased endurance of pelvic floor muscles from 10 sec hold w/ 4 reps to 10 sec to 10 reps without fatigue of mm in order to hike without leakage.   Time 12   Period Weeks   Status Achieved   PT LONG TERM GOAL #4   Title Pt will report Type 3-4 stool according to [place] for 1 week and without digital assist w/ bowel movements in order to demo improved pelvic floor Sx.    Status Achieved                 G-Codes - 2015/05/06 1110    Functional Assessment Tool Used IOQ: 56.8%    Functional  Limitation Self care   Self Care Current Status (R0301) At least 40 percent but less than 60 percent impaired, limited or restricted   Self Care Goal Status (O9969) At least 60 percent but less than 80 percent impaired, limited or restricted       Problem List Patient Active Problem List   Diagnosis Date Noted  . Osteopenia 03/17/2015  . H/O adenomatous polyp of colon 02/04/2015  . Anxiety 01/11/2015  . 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 Arthritis of hand, degenerative 01/11/2015  . Anxiety state 08/04/2014  . Mixed incontinence 08/03/2014  . LBP (low back pain) 03/12/2014  . Hemochromatosis 02/10/2014  . ALLERGIC RHINITIS CAUSE UNSPECIFIED 11/29/2010  . UNSPECIFIED OSTEOPOROSIS 11/29/2010  . HEMOCHROMATOSIS, HX OF 11/29/2010    Jerl Mina  ,PT, DPT, E-RYT  2015-05-06, 11:14 AM  [place] [facility] SERVICES [address] [place], [place], [address] Phone: [phone]   Fax:  [phone]

## 2015-03-07 ENCOUNTER — Encounter: Payer: Self-pay | Admitting: Physical Therapy

## 2015-03-07 ENCOUNTER — Ambulatory Visit: Payer: Medicare Other | Attending: Obstetrics and Gynecology | Admitting: Physical Therapy

## 2015-03-07 ENCOUNTER — Telehealth: Payer: Self-pay | Admitting: Urology

## 2015-03-07 ENCOUNTER — Ambulatory Visit: Payer: Medicare Other | Admitting: Physical Therapy

## 2015-03-07 DIAGNOSIS — R278 Other lack of coordination: Secondary | ICD-10-CM | POA: Diagnosis present

## 2015-03-07 DIAGNOSIS — N8189 Other female genital prolapse: Secondary | ICD-10-CM | POA: Insufficient documentation

## 2015-03-07 DIAGNOSIS — N3281 Overactive bladder: Secondary | ICD-10-CM

## 2015-03-07 NOTE — Telephone Encounter (Signed)
Can pt have refills on myrbetriq? cw,lpn

## 2015-03-07 NOTE — Therapy (Signed)
[place] MAIN [facility] SERVICES [address] [place], [place], [address] Phone: [phone]   Fax:  [phone]  Physical Therapy Treatment  Patient Details  Name: Judith Kim MRN: [id number] Date of Birth: 12/19/1935 Referring Provider:  Roda Shutters, FNP  Encounter Date: 03/07/2015      PT End of Session - 03/07/15 1252    Visit Number 2   Number of Visits 12   PT Start Time 1104   PT Stop Time 1210   PT Time Calculation (min) 66 min   Activity Tolerance Patient tolerated treatment well   Behavior During Therapy [facility] for tasks assessed/performed      Past Medical History  Diagnosis Date  . Stress incontinence   . Colon polyp   . Anemia   . Constipation   . Osteoarthritis of both hands   . Osteopenia   . Overactive bladder   . Hemochromatosis   . GERD (gastroesophageal reflux disease)   . H/O hematuria   . Osteoporosis   . Cataract cortical, senile   . Depression   . Chronic kidney disease   . Anxiety   . Incontinence in female   . Vaginal atrophy   . Hyperlipidemia     since 10/2013,  Dr. Candiss Norse has cleared her from meds and reported to her her levels are [facility].     Past Surgical History  Procedure Laterality Date  . Esophagogastroduodenoscopy    . Tonsillectomy    . Cataract extraction    . Colonoscopy    . Flexible sigmoidoscopy      There were no vitals filed for this visit.  Visit Diagnosis:  Coordination abnormal  Pelvic floor weakness      Subjective Assessment - 03/07/15 1112    Subjective Pt reports she is conscious of how she sits, and doing her HEP throughout the day. Pt reports she has to push w/ abdomen to completely empty her bladder 100% in order to avoid leaking afterwards. Pt reports she uses her finger to faciliate bowel movements 75% of the time (5x/week) Type 1-3 Bristol Scale. Only 3-4x/week she experiences naturally flow of Type 4.    Pertinent History Hx of 2 vaginal deliveries,  denied pain. Fx occured on sacrum where pt notices pressure sensation.    Limitations Other (comment)  walking    Patient Stated Goals decrease incontinence , get off medications for Sx, hiking/ kayaking                       Pelvic Floor Special Questions - 03/07/15 1237    Diastasis Recti --  initated abdominal massage today   Strength good squeeze, good lift, able to hold agaisnt strong resistance   Strength # of reps 6  on eval , 4 reps (correction from first column)   Strength # of seconds 10  on eval 10 sec (correction from first column)   Biofeedback cues for complete lowering of pelvic floor with rest preiods and use of elevator imagery           [facility] Adult PT Treatment/Exercise - 03/07/15 1222    Bed Mobility   Bed Mobility Supine to Sit   Supine to Sit Other (comment)  required cuing today   Exercises   Other Exercises  pelvic floor exercise long holds: 10 sec, 6 reps  breathing w/ bowel movements   Manual Therapy   Manual Therapy Myofascial release   Manual therapy comments abdominal massage  guided pt for HEP after performing massage                PT Education - 03/07/15 1240    Education provided Yes   Education Details HEP   Person(s) Educated Patient   Methods Explanation;Demonstration;Tactile cues;Verbal cues;Handout   Comprehension Verbalized understanding;Returned demonstration             PT Long Term Goals - 03/07/15 1258    PT LONG TERM GOAL #1   Title Pt will score an increased % from 56.8% on IOQ-L to > 75% in order to improve QOL.   Time 12   Period Weeks   Status New   PT LONG TERM GOAL #2   Title Pt will demo decreased abdominal separation from 3 fingers width below umbilicus to < 2 fingers width in order to lift and carry kayak with minimized risk for injuries.    Time 12   Period Weeks   Status New   PT LONG TERM GOAL #3   Title Pt will demo increased endurance of pelvic floor muscles from 10 sec hold  w/ 4 reps to 10 sec to 10 reps without fatigue of mm in order to hike without leakage.   Time 12   Period Weeks   Status New   PT LONG TERM GOAL #4   Title Pt will report Type 3-4 stool according to [place] for 1 week and without digital assist w/ bowel movements in order to demo improved pelvic floor Sx.    Time 12   Period Weeks   Status New               Plan - 03/07/15 1253    Clinical Impression Statement Pt shows good carry over with pelvic floor HEP but required minor cuing for relaxation of pelvic floor muscles. Addressed pt's difficulty w/ complete elimination of bowels and urine w/ education on emphasizing relaxation of pelvic floor mm, proper toileting posture, and abdominal massage. Pt will continue to benefit from skilled PT in order to achieve goals.    Pt will benefit from skilled therapeutic intervention in order to improve on the following deficits Decreased mobility;Postural dysfunction;Decreased strength;Improper body mechanics;Decreased safety awareness;Decreased endurance;Decreased coordination;Decreased range of motion;Decreased balance;Decreased activity tolerance   Rehab Potential Good   Clinical Impairments Affecting Rehab Potential hx of vaginal deliveries, Fx of pelvis, immobilized for 3 months   PT Frequency 1x / week   PT Duration 12 weeks   PT Treatment/Interventions ADLs/Self Care Home Management;Aquatic Therapy;Biofeedback;Electrical Stimulation;Cryotherapy;Gait training;Neuromuscular re-education;Manual techniques;Energy conservation;Functional mobility training;Moist Heat;Therapeutic activities;Therapeutic exercise;Balance training;Other (comment)  modifications to fitness routine to minimize further pelvic floor dysfunction   PT Next Visit Plan assess fitness routine and modify appropriately   Consulted and Agree with Plan of Care Patient        Problem List Patient Active Problem List   Diagnosis Date Noted  . ALLERGIC RHINITIS  CAUSE UNSPECIFIED 11/29/2010  . UNSPECIFIED OSTEOPOROSIS 11/29/2010  . HEMOCHROMATOSIS, HX OF 11/29/2010    Jerl Mina ,PT, DPT, E-RYT  03/07/2015, 12:59 PM  [place] MAIN [facility] SERVICES [address] [place], [place], [address] Phone: [phone]   Fax:  [phone]

## 2015-03-07 NOTE — Telephone Encounter (Signed)
Okay to refill? 

## 2015-03-07 NOTE — Telephone Encounter (Signed)
Pt had an appointment with Dr. Elnoria Howard that needed to be rescheduled due to Dr. Elnoria Howard being out of the office. She has been rescheduled to see Dr. Ernestine Conrad on 03/16/15 @ 3:15pm. Pt has asked if she could receive a refill of her mirabegron ER (MYRBETRIQ) 25 MG TB24 tablet for 1 month. Best contact # [phone] 03/07/15 maf

## 2015-03-07 NOTE — Patient Instructions (Addendum)
     2) progress pelvic floor long holds to 6 reps at 10 sec, 5 x times  3) bring in self-selected exercise routine next week and food diary. Provided web resources for green/whole foods fiber products  4) log rolling  5) belly massage

## 2015-03-08 ENCOUNTER — Other Ambulatory Visit: Payer: Self-pay | Admitting: *Deleted

## 2015-03-08 MED ORDER — MIRABEGRON ER 25 MG PO TB24: 25.0000 mg | ORAL_TABLET | Freq: Every day | ORAL | Status: DC

## 2015-03-08 NOTE — Telephone Encounter (Signed)
Spoke with patient and let her know we will sent in a refill of the myrbetriq.

## 2015-03-14 ENCOUNTER — Other Ambulatory Visit: Payer: Self-pay | Admitting: Oncology

## 2015-03-14 ENCOUNTER — Encounter: Payer: Self-pay | Admitting: Physical Therapy

## 2015-03-14 ENCOUNTER — Ambulatory Visit: Payer: Medicare Other | Admitting: Physical Therapy

## 2015-03-14 DIAGNOSIS — R278 Other lack of coordination: Secondary | ICD-10-CM

## 2015-03-14 DIAGNOSIS — N8189 Other female genital prolapse: Secondary | ICD-10-CM

## 2015-03-14 NOTE — Therapy (Signed)
[place] MAIN [facility] SERVICES [address] [place], [place], [address] Phone: [phone]   Fax:  [phone]  Physical Therapy Treatment  Patient Details  Name: Judith Kim MRN: [id number] Date of Birth: 03/10/1936 Referring Provider:  Roda Shutters, FNP  Encounter Date: 03/14/2015      PT End of Session - 03/14/15 1419    Visit Number 3   Number of Visits 12   PT Start Time 0955   PT Stop Time 1055   PT Time Calculation (min) 60 min   Activity Tolerance Patient tolerated treatment well   Behavior During Therapy [facility] for tasks assessed/performed      Past Medical History  Diagnosis Date  . Stress incontinence   . Colon polyp   . Anemia   . Constipation   . Osteoarthritis of both hands   . Osteopenia   . Overactive bladder   . Hemochromatosis   . GERD (gastroesophageal reflux disease)   . H/O hematuria   . Osteoporosis   . Cataract cortical, senile   . Depression   . Chronic kidney disease   . Anxiety   . Incontinence in female   . Vaginal atrophy   . Hyperlipidemia     since 10/2013,  Dr. Candiss Norse has cleared her from meds and reported to her her levels are [facility]     Past Surgical History  Procedure Laterality Date  . Esophagogastroduodenoscopy    . Tonsillectomy    . Cataract extraction    . Colonoscopy    . Flexible sigmoidoscopy      There were no vitals filed for this visit.  Visit Diagnosis:  Coordination abnormal  Pelvic floor weakness      Subjective Assessment - 03/14/15 0957    Subjective Pt continues to be compliant with HEP.  The stool softeners are not effective at all. She will be having a colonoscopy on the 6/2/6.    Pertinent History Hx of 2 vaginal deliveries, denied pain. Fx occured on sacrum where pt notices pressure sensation.    Patient Stated Goals decrease incontinence , get off medications for Sx, hiking/ kayaking                          [facility] Adult PT  Treatment/Exercise - 03/14/15 0001    Exercises   Exercises Other Exercises   Other Exercises  modifications to her exercise routine.  cues for deep core, alignment                PT Education - 03/14/15 1127    Education provided Yes   Education Details modifications to her exercises to minimize downward pressure on pelvic floor and to optimize deep core, minimize risks for injuries   Person(s) Educated Patient   Methods Explanation;Demonstration;Tactile cues;Verbal cues   Comprehension Verbalized understanding;Returned demonstration             PT Long Term Goals - 03/07/15 1258    PT LONG TERM GOAL #1   Title Pt will score an increased % from 56.8% on IOQ-L to > 75% in order to improve QOL.   Time 12   Period Weeks   Status New   PT LONG TERM GOAL #2   Title Pt will demo decreased abdominal separation from 3 fingers width below umbilicus to < 2 fingers width in order to lift and carry kayak with minimized risk for injuries.    Time 12  Period Weeks   Status New   PT LONG TERM GOAL #3   Title Pt will demo increased endurance of pelvic floor muscles from 10 sec hold w/ 4 reps to 10 sec to 10 reps without fatigue of mm in order to hike without leakage.   Time 12   Period Weeks   Status New   PT LONG TERM GOAL #4   Title Pt will report Type 3-4 stool according to [place] for 1 week and without digital assist w/ bowel movements in order to demo improved pelvic floor Sx.    Time 12   Period Weeks   Status New               Plan - 03/14/15 1419    Clinical Impression Statement Pt required modifications to the exrcise shehas been performing for the past 5 months inorder to minimize worsening of her symptoms. Pt received ample education on which exercises to discontinue (i.e. Active ASLR, sit-ups) and proper alignment and muscle activation to optimize deep core and outer core systems. Pt was educated on selecting more whole foods over canned foods  for improved bowels. Pt require PT to continue addressing her goals.    Pt will benefit from skilled therapeutic intervention in order to improve on the following deficits Decreased mobility;Postural dysfunction;Decreased strength;Improper body mechanics;Decreased safety awareness;Decreased endurance;Decreased coordination;Decreased range of motion;Decreased balance;Decreased activity tolerance   Rehab Potential Good   Clinical Impairments Affecting Rehab Potential hx of vaginal deliveries, Fx of pelvis, immobilized for 3 months   PT Frequency 1x / week   PT Duration 12 weeks   PT Treatment/Interventions ADLs/Self Care Home Management;Aquatic Therapy;Biofeedback;Electrical Stimulation;Cryotherapy;Gait training;Neuromuscular re-education;Manual techniques;Energy conservation;Functional mobility training;Moist Heat;Therapeutic activities;Therapeutic exercise;Balance training;Other (comment)  modifications to fitness routine to minimize further pelvic floor dysfunction   PT Next Visit Plan address DRA    PT Home Exercise Plan progress pelvic floor exercises, dynamic stabilization series   Consulted and Agree with Plan of Care Patient        Problem List Patient Active Problem List   Diagnosis Date Noted  . ALLERGIC RHINITIS CAUSE UNSPECIFIED 11/29/2010  . UNSPECIFIED OSTEOPOROSIS 11/29/2010  . HEMOCHROMATOSIS, HX OF 11/29/2010    Jerl Mina ,PT, DPT, E-RYT  03/14/2015, 2:23 PM  [place] MAIN [facility] SERVICES [address] [place], [place], [address] Phone: [phone]   Fax:  [phone]

## 2015-03-14 NOTE — Patient Instructions (Signed)
Modifications to exercises she has been doing for the past 5 months.  (include deep core activation, proper alignment, no more breathholding and discontinue active SLR and sit-ups)

## 2015-03-15 ENCOUNTER — Inpatient Hospital Stay (HOSPITAL_BASED_OUTPATIENT_CLINIC_OR_DEPARTMENT_OTHER): Payer: Medicare Other | Admitting: Oncology

## 2015-03-15 ENCOUNTER — Inpatient Hospital Stay: Payer: Medicare Other

## 2015-03-15 ENCOUNTER — Encounter (INDEPENDENT_AMBULATORY_CARE_PROVIDER_SITE_OTHER): Payer: Self-pay

## 2015-03-15 ENCOUNTER — Inpatient Hospital Stay: Payer: Medicare Other | Attending: Oncology

## 2015-03-15 VITALS — BP 115/60 | HR 75 | Temp 98.2°F | Resp 20 | Wt 125.4 lb

## 2015-03-15 VITALS — BP 123/67 | HR 79 | Temp 98.1°F | Resp 18

## 2015-03-15 DIAGNOSIS — E785 Hyperlipidemia, unspecified: Secondary | ICD-10-CM | POA: Diagnosis not present

## 2015-03-15 DIAGNOSIS — K5900 Constipation, unspecified: Secondary | ICD-10-CM | POA: Diagnosis not present

## 2015-03-15 DIAGNOSIS — K219 Gastro-esophageal reflux disease without esophagitis: Secondary | ICD-10-CM | POA: Diagnosis not present

## 2015-03-15 DIAGNOSIS — Z862 Personal history of diseases of the blood and blood-forming organs and certain disorders involving the immune mechanism: Secondary | ICD-10-CM

## 2015-03-15 DIAGNOSIS — M1990 Unspecified osteoarthritis, unspecified site: Secondary | ICD-10-CM | POA: Diagnosis not present

## 2015-03-15 DIAGNOSIS — N393 Stress incontinence (female) (male): Secondary | ICD-10-CM

## 2015-03-15 DIAGNOSIS — Z79899 Other long term (current) drug therapy: Secondary | ICD-10-CM | POA: Insufficient documentation

## 2015-03-15 DIAGNOSIS — N189 Chronic kidney disease, unspecified: Secondary | ICD-10-CM

## 2015-03-15 DIAGNOSIS — R32 Unspecified urinary incontinence: Secondary | ICD-10-CM | POA: Insufficient documentation

## 2015-03-15 DIAGNOSIS — D649 Anemia, unspecified: Secondary | ICD-10-CM | POA: Diagnosis not present

## 2015-03-15 DIAGNOSIS — K635 Polyp of colon: Secondary | ICD-10-CM | POA: Diagnosis not present

## 2015-03-15 DIAGNOSIS — F419 Anxiety disorder, unspecified: Secondary | ICD-10-CM | POA: Insufficient documentation

## 2015-03-15 DIAGNOSIS — M818 Other osteoporosis without current pathological fracture: Secondary | ICD-10-CM

## 2015-03-15 LAB — CBC WITH DIFFERENTIAL/PLATELET
Basophils Absolute: 0 10*3/uL (ref 0–0.1)
Basophils Relative: 1 %
Eosinophils Absolute: 0.1 10*3/uL (ref 0–0.7)
Eosinophils Relative: 1 %
HEMATOCRIT: 38.2 % (ref 35.0–47.0)
Hemoglobin: 12.8 g/dL (ref 12.0–16.0)
LYMPHS ABS: 1.4 10*3/uL (ref 1.0–3.6)
LYMPHS PCT: 25 %
MCH: 32.5 pg (ref 26.0–34.0)
MCHC: 33.4 g/dL (ref 32.0–36.0)
MCV: 97.2 fL (ref 80.0–100.0)
MONOS PCT: 9 %
Monocytes Absolute: 0.5 10*3/uL (ref 0.2–0.9)
Neutro Abs: 3.7 10*3/uL (ref 1.4–6.5)
Neutrophils Relative %: 64 %
Platelets: 322 10*3/uL (ref 150–440)
RBC: 3.93 MIL/uL (ref 3.80–5.20)
RDW: 12.8 % (ref 11.5–14.5)
WBC: 5.8 10*3/uL (ref 3.6–11.0)

## 2015-03-15 LAB — FERRITIN: FERRITIN: 86 ng/mL (ref 11–307)

## 2015-03-15 NOTE — Progress Notes (Signed)
Verified with Dr. Grayland Ormond about proceeding with phlebotomy

## 2015-03-16 ENCOUNTER — Ambulatory Visit: Payer: Self-pay | Admitting: Urology

## 2015-03-17 ENCOUNTER — Ambulatory Visit (INDEPENDENT_AMBULATORY_CARE_PROVIDER_SITE_OTHER): Payer: Medicare Other | Admitting: Urology

## 2015-03-17 ENCOUNTER — Encounter: Payer: Self-pay | Admitting: Urology

## 2015-03-17 VITALS — BP 129/72 | HR 76 | Ht 66.0 in | Wt 124.1 lb

## 2015-03-17 DIAGNOSIS — R3129 Other microscopic hematuria: Secondary | ICD-10-CM

## 2015-03-17 DIAGNOSIS — R312 Other microscopic hematuria: Secondary | ICD-10-CM | POA: Diagnosis not present

## 2015-03-17 DIAGNOSIS — N393 Stress incontinence (female) (male): Secondary | ICD-10-CM

## 2015-03-17 DIAGNOSIS — M8580 Other specified disorders of bone density and structure, unspecified site: Secondary | ICD-10-CM | POA: Insufficient documentation

## 2015-03-17 LAB — MICROSCOPIC EXAMINATION

## 2015-03-17 LAB — URINALYSIS, COMPLETE
Bilirubin, UA: NEGATIVE
Glucose, UA: NEGATIVE
Ketones, UA: NEGATIVE
Leukocytes, UA: NEGATIVE
NITRITE UA: NEGATIVE
PH UA: 7 (ref 5.0–7.5)
Protein, UA: NEGATIVE
Specific Gravity, UA: 1.01 (ref 1.005–1.030)
Urobilinogen, Ur: 0.2 mg/dL (ref 0.2–1.0)

## 2015-03-17 NOTE — Progress Notes (Signed)
03/17/2015 11:13 AM   Judith Kim 1936-08-02 [id number]  Referring provider: Glendon Axe, MD [facility] [place], [place] [address]  Chief Complaint  Patient presents with  . Hematuria    microscopic urinalysis   . Urinary Incontinence    mixed incontinence w/ no improvement with the Myrbetriq    HPI: This is a 79 year old female who presents today in follow-up for urinary incontinence. The patient also was noted to have microscopic hematuria. The patient states that her began approximately 3 years ago. She reportedly had a pelvic fracture about one year ago and had significant progression of her urinary incontinence. Predominantly she describes leakage with bending over, coughing, sneezing, and walking. She denies any symptoms of urgency or urge incontinence. She goes through 2 pads per day. She finds that if she voids every 2 hours her symptoms are not quite as bad. She was treated with 25 mg of Myrbetriq daily which has not had any significant improvement. She is also seen and evaluated by pelvic floor physical therapy and has undergone 3's of 4 office visit. The patient states that she has a fairly weak stream, often has to strain to void, and describes to me what sounds like Valsalva voiding.  However, she does feels that she empties her bladder completely. The patient also struggles with constipation. She denies any pelvic pain. She has no history of dyspareunia, however has been sexually inactive for over 10 years. The patient has no neurologic history and no significant GYN/urologic past medical history. She maintains her uterus and ovaries. She's had 2 vaginal deliveries. She denies any history of prolapse-like symptoms.   The patient was also noted to have microscopic hematuria at her last follow-up visit. The patient tells me that this is been documented over the past 15 years. It was achieved due to vaginal atrophy. She states that she has had a  complete workup although this is been years. She did have a abdominal/pelvis CT scan about 1 year prior when she fractured her pelvis. Unfortunately, these images are in [place]. She does relate that they did not tell her anything about stones or large renal masses.the patient denies a history of recurrent urinary tract infections. She denies any dysuria. She denies any flank pain. She has no history of kidney stones.   PMH: Past Medical History  Diagnosis Date  . Stress incontinence   . Colon polyp   . Anemia   . Constipation   . Osteoarthritis of both hands   . Osteopenia   . Overactive bladder   . Hemochromatosis   . GERD (gastroesophageal reflux disease)   . H/O hematuria   . Osteoporosis   . Cataract cortical, senile   . Depression   . Chronic kidney disease   . Anxiety   . Incontinence in female   . Vaginal atrophy   . Hyperlipidemia     since 10/2013,  Dr. Candiss Norse has cleared her from meds and reported to her her levels are [facility].     Surgical History: Past Surgical History  Procedure Laterality Date  . Esophagogastroduodenoscopy    . Tonsillectomy    . Cataract extraction    . Colonoscopy    . Flexible sigmoidoscopy      Home Medications:    Medication List       This list is accurate as of: 03/17/15 11:13 AM.  Always use your most recent med list.  calcium carbonate 1250 (500 CA) MG tablet  Commonly known as:  OS-CAL - dosed in mg of elemental calcium  Take by mouth.     ciclopirox 8 % solution  Commonly known as:  PENLAC  Apply topically at bedtime. Apply over nail and surrounding skin. Apply daily over previous coat. After seven (7) days, may remove with alcohol and continue cycle.     diphenhydrAMINE 50 MG capsule  Commonly known as:  BENADRYL  Take 50 mg by mouth every 6 (six) hours as needed.     docusate sodium 100 MG capsule  Commonly known as:  COLACE  Take 100 mg by mouth 2 (two) times daily.     estradiol 0.1 MG/GM vaginal  cream  Commonly known as:  ESTRACE  Place 1 Applicatorful vaginally at bedtime.     Flaxseed Oil 1000 MG Caps  Take by mouth.     MULTI-VITAMINS Tabs  Take by mouth.     Omega 3 1200 MG Caps  Take 1 capsule by mouth daily.     polycarbophil 625 MG tablet  Commonly known as:  FIBERCON  Take 625 mg by mouth daily.     polyethylene glycol powder powder  Commonly known as:  GLYCOLAX/MIRALAX     PROBIOTIC & ACIDOPHILUS EX ST PO  Take by mouth.     Vitamin B-12 1000 MCG Subl  Take by mouth.     Vitamin D3 5000 UNITS Caps  Take 1 capsule by mouth daily.        Allergies:  Allergies  Allergen Reactions  . Nitrofurantoin     Family History: Family History  Problem Relation Age of Onset  . Bipolar disorder Father   . Cancer Mother     Social History:  reports that she has never smoked. She does not have any smokeless tobacco history on file. She reports that she drinks about 21.0 oz of alcohol per week. Her drug history is not on file.  Physical Exam: BP 129/72 mmHg  Pulse 76  Ht 5\' 6"  (1.676 m)  Wt 56.291 kg (124 lb 1.6 oz)  BMI 20.04 kg/m2  Constitutional:  Alert and oriented, No acute distress. Neurologic: Grossly intact, no focal deficits, moving all 4 extremities. Psychiatric: Normal mood and affect.  Laboratory Data: Lab Results  Component Value Date   WBC 5.8 03/15/2015   HGB 12.8 03/15/2015   HCT 38.2 03/15/2015   MCV 97.2 03/15/2015   PLT 322 03/15/2015    No results found for: CREATININE  No results found for: PSA  No results found for: TESTOSTERONE  No results found for: HGBA1C  Urinalysis No results found for: COLORURINE, APPEARANCEUR, LABSPEC, PHURINE, GLUCOSEU, HGBUR, BILIRUBINUR, KETONESUR, PROTEINUR, UROBILINOGEN, NITRITE, LEUKOCYTESUR  Pertinent Imaging: none  Assessment & Plan:    1. Microscopic hematuria Our plan is to obtain a renal ultrasound between now and her follow-up visit. At her follow-up visit we'll plan to perform  cystoscopy.  2. Stress incontinence, female The patient almost certainly has predominantly stress urinary incontinence. She denies any real history of urgency or urge incontinence. As such, I stopped Myrbetric. She does however have a interesting finding of a week stream with Valsalva voiding. I wonder if this is a result of her prior pelvic fracture. We discussed treatment modalities including ongoing pelvic floor physical therapy which she does not feel particularly has been helpful. We also discussed performing a mid urethral sling or perhaps urethral bulking procedure. However, I think given the uncertainty of her  detrusor function I would like to obtain urodynamics prior.   Return in about 6 weeks (around 04/28/2015).  Ardis Hughs, [facility] [address], [place], [place] [address] [phone]

## 2015-03-17 NOTE — Patient Instructions (Signed)
Obtain UDS and renal u/s at [facility]. Follow-up in 6 weeks after tests. Stop Myrbetriq Continue with Estrace

## 2015-03-19 ENCOUNTER — Encounter: Payer: Self-pay | Admitting: Oncology

## 2015-03-21 ENCOUNTER — Ambulatory Visit: Payer: Medicare Other | Admitting: Physical Therapy

## 2015-03-21 DIAGNOSIS — R278 Other lack of coordination: Secondary | ICD-10-CM

## 2015-03-21 DIAGNOSIS — N8189 Other female genital prolapse: Secondary | ICD-10-CM

## 2015-03-21 NOTE — Patient Instructions (Addendum)
5 week Program  Throughout: when engagin in your fitness program: exhale on exertion Decreased straining with bowel movements   1st:  A.Pelvic Floor exercises laying down 8 reps at 10 sec  BQucks 8  C. Seated Quicks 8 reps  D. Dynamic Stabilization Level 1-2  3 x day  2nd week: Pelvic floor : 10 reps , 10 quicks  Seated Pelvic floor exercises long holds 5 reps, quicks 10 reps Dynamic Stabilization 1-2   3rd week:  Pelvic floor : 12reps , 12 quicks  Seated Pelvic floor exercises long holds 7 reps, quick rep 10reps Dynamic Stabilization 3 (marching)   4th week:  Pelvic floor : 12reps , 12 quicks  Seated Pelvic floor exercises long holds 10 reps, quick rep 10reps Dynamic Stabilization 4: heel slide  5th week:  Pelvic floor : 14reps , 14 quicks  Seated Pelvic floor exercises long holds 10 reps, quick rep 10reps Dynamic Stabilization 3-4:    Nutrition resources:  Warm breakfast Miso soup  Steamed veggies, less raw  [email or web address]  Medical Nutrition Therapy at [facility]  [email or web address]   Deep core system:  Allison Quarry PT youtube [email or web address]  [email or web address]  [email or web address]

## 2015-03-22 NOTE — Therapy (Addendum)
[place] MAIN [facility] SERVICES [address] [place], [place], [address] Phone: [phone]   Fax:  [phone]    PHYSICAL THERAPY DISCHARGE SUMMARY  Visits from Start of Care: 4  Current functional level related to goals / functional outcomes: SEE BELOW   Remaining deficits:see BELOW    Education / Equipment: see BELOW Plan: Patient agrees to discharge.  2/4  Patient goals were met. 1/4 WAS PARTIAL MET. 1/4 WAS NOT ASSESSED.  Patient is being discharged due to the patient's request.  ?????         Physical Therapy Treatment  Patient Details  Name: Judith Kim MRN: [id number] Date of Birth: 1936-06-02 Referring Provider:  Roda Shutters, FNP  Encounter Date: 03/21/2015    Past Medical History  Diagnosis Date  . Stress incontinence   . Colon polyp   . Anemia   . Constipation   . Osteoarthritis of both hands   . Osteopenia   . Overactive bladder   . Hemochromatosis   . GERD (gastroesophageal reflux disease)   . H/O hematuria   . Osteoporosis   . Cataract cortical, senile   . Depression   . Chronic kidney disease   . Anxiety   . Incontinence in female   . Vaginal atrophy   . Hyperlipidemia     since 10/2013,  Dr. Candiss Norse has cleared her from meds and reported to her her levels are [facility].     Past Surgical History  Procedure Laterality Date  . Esophagogastroduodenoscopy    . Tonsillectomy    . Cataract extraction    . Colonoscopy    . Flexible sigmoidoscopy    . Colonoscopy with propofol N/A 03/28/2015    Procedure: COLONOSCOPY WITH PROPOFOL;  Surgeon: Manya Silvas, MD;  Location: [facility] ENDOSCOPY;  Service: Endoscopy;  Laterality: N/A;    There were no vitals filed for this visit.  Visit Diagnosis:  Coordination abnormal  Pelvic floor weakness                    Pelvic Floor Special Questions - 03/21/15 0948    Exam Type Vaginal;Rectal  rectal: increased tension L puborectalis  (anterior)   Palpation Demo'd extreme bearing down of rectal sphincter w/ bowel movement   Strength (VAGINAL)  strong squeeze, against strong resistance   Strength # of reps 8   Strength # of seconds 10   Biofeedback required cuing for pause btw inhale/exhale during rest breaks                         PT Long Term Goals - 03/22/15 1634    PT LONG TERM GOAL #1   Title Pt will score an increased % from 56.8% on IOQ-L to > 75% in order to improve QOL.  (D/C 03/21/15: 67%)    Time 12   Period Weeks   Status Partially Met   PT LONG TERM GOAL #2   Title Pt will demo decreased abdominal separation from 3 fingers width below umbilicus to < 2 fingers width in order to lift and carry kayak with minimized risk for injuries.    Time 12   Period Weeks   Status Unable to assess   PT LONG TERM GOAL #3   Title Pt will demo increased endurance of pelvic floor muscles from 10 sec hold w/ 4 reps to 10 sec to 10 reps without fatigue of mm in order to hike without  leakage.   Time 12   Period Weeks   Status Achieved   PT LONG TERM GOAL #4   Title Pt will report Type 3-4 stool according to [place] for 1 week and without digital assist w/ bowel movements in order to demo improved pelvic floor Sx.    Status Achieved                 G-Codes - 05/16/15 1115    Functional Assessment Tool Used IOQ: 56.8% , @ D/C  6/20: 67%   Functional Limitation Self care   Self Care Current Status (J4830) At least 40 percent but less than 60 percent impaired, limited or restricted   Self Care Goal Status (N3543) At least 60 percent but less than 80 percent impaired, limited or restricted   Self Care Discharge Status [phone]) At least 60 percent but less than 80 percent impaired, limited or restricted      Problem List Patient Active Problem List   Diagnosis Date Noted  . Osteopenia 03/17/2015  . H/O adenomatous polyp of colon 02/04/2015  . Anxiety 01/11/2015  . Atonic  constipation 01/11/2015  . Arthritis of hand, degenerative 01/11/2015  . Anxiety state 08/04/2014  . Mixed incontinence 08/03/2014  . LBP (low back pain) 03/12/2014  . Hemochromatosis 02/10/2014  . ALLERGIC RHINITIS CAUSE UNSPECIFIED 11/29/2010  . UNSPECIFIED OSTEOPOROSIS 11/29/2010  . HEMOCHROMATOSIS, HX OF 11/29/2010    Jerl Mina ,PT, DPT, E-RYT  16-May-2015, 11:17 AM  [place] MAIN [facility] SERVICES [address] [place], [place], [address] Phone: [phone]   Fax:  [phone]

## 2015-03-22 NOTE — Therapy (Deleted)
[place] MAIN [facility] SERVICES [address] [place], [place], [address] Phone: [phone]   Fax:  [phone]  Physical Therapy Treatment  Patient Details  Name: Judith Kim MRN: [id number] Date of Birth: 1936-02-09 Referring Provider:  Roda Shutters, FNP  Encounter Date: 03/21/2015      PT End of Session - 03/22/15 1257    Visit Number 4   Number of Visits 12   PT Start Time 0815   PT Stop Time 0930   PT Time Calculation (min) 75 min   Activity Tolerance Patient tolerated treatment well   Behavior During Therapy [facility] for tasks assessed/performed      Past Medical History  Diagnosis Date  . Stress incontinence   . Colon polyp   . Anemia   . Constipation   . Osteoarthritis of both hands   . Osteopenia   . Overactive bladder   . Hemochromatosis   . GERD (gastroesophageal reflux disease)   . H/O hematuria   . Osteoporosis   . Cataract cortical, senile   . Depression   . Chronic kidney disease   . Anxiety   . Incontinence in female   . Vaginal atrophy   . Hyperlipidemia     since 10/2013,  Dr. Candiss Norse has cleared her from meds and reported to her her levels are [facility]     Past Surgical History  Procedure Laterality Date  . Esophagogastroduodenoscopy    . Tonsillectomy    . Cataract extraction    . Colonoscopy    . Flexible sigmoidoscopy      There were no vitals filed for this visit.  Visit Diagnosis:  Coordination abnormal  Pelvic floor weakness          [facility] PT Assessment - 03/21/15 0946    Bed Mobility   Supine to Sit --  minimal cuing w/ log rolling                  Pelvic Floor Special Questions - 03/21/15 0948    Exam Type Vaginal;Rectal  rectal: increased tension L puborectalis (anterior)   Palpation Demo'd extreme bearing down of rectal sphincter w/ bowel movement   Strength strong squeeze, against strong resistance   Strength # of reps 8   Strength # of seconds 10    Biofeedback required vuing for pause btw inhale/exhale during rest breaks            [facility] Adult PT Treatment/Exercise - 03/22/15 1256    Bed Mobility   Supine to Sit --  minimal cuing w/ log rolling   Posture/Postural Control   Posture Comments Dynamic Stabilization Series   pt demo'd correctly   Exercises   Exercises Other Exercises   Other Exercises  neuro=red: breathing w/ pause , neutral spine in hooklying for proper pelvic-ribcage alignment, seated alignment for pelvic floor contractions   Manual Therapy   Manual Therapy Internal Pelvic Floor  rectal   Internal Pelvic Floor released tensions on L puborectalis w/ susatined pressure and guided breathing                PT Education - 03/22/15 1257    Education provided Yes   Education Details HEP, D/C    Person(s) Educated Patient   Methods Explanation;Demonstration;Tactile cues;Verbal cues;Handout   Comprehension Verbalized understanding;Returned demonstration             PT Long Term Goals - 03/22/15 1634    PT LONG TERM GOAL #1  Title Pt will score an increased % from 56.8% on IOQ-L to > 75% in order to improve QOL.  (D/C 03/21/15: 67%)    Time 12   Period Weeks   Status Partially Met   PT LONG TERM GOAL #2   Title Pt will demo decreased abdominal separation from 3 fingers width below umbilicus to < 2 fingers width in order to lift and carry kayak with minimized risk for injuries.    Time 12   Period Weeks   Status Unable to assess   PT LONG TERM GOAL #3   Title Pt will demo increased endurance of pelvic floor muscles from 10 sec hold w/ 4 reps to 10 sec to 10 reps without fatigue of mm in order to hike without leakage.   Time 12   Period Weeks   Status Achieved   PT LONG TERM GOAL #4   Title Pt will report Type 3-4 stool according to [place] for 1 week and without digital assist w/ bowel movements in order to demo improved pelvic floor Sx.    Status Achieved                Plan - 03/22/15 1258    Clinical Impression Statement Pt has achieved 2/4 goals and partially met one goal with one remaining goal without being to assess. Pt showed significantly improved deep core coordination with pelvic floor function and fitness routine. Pt has increased her pelvic floor strength endurance by 50% with improved positioning of pelvic organs showing increased fascial tensigrity and effective deep core mm function. Pt's bowel Sx have also improved with decreased need for straining nor digital assistance to evacuate and is forming normal stool consistency. Pt selects to be d/c at this time due to leaving for a month long trip. Pt was provided ample resources to continue improving high fiber diet for decreased need for use of laxative, thus decreased straining. Pt was also educated on the importance of safe modification of her exercises and out of bed technique to minimize abdominal/pelvic floor straining which contributes to stress urianry incontinence. Pt is ready for D/C at this time.     Pt will benefit from skilled therapeutic intervention in order to improve on the following deficits Decreased mobility;Postural dysfunction;Decreased strength;Improper body mechanics;Decreased safety awareness;Decreased endurance;Decreased coordination;Decreased range of motion;Decreased balance;Decreased activity tolerance   Rehab Potential Good   Clinical Impairments Affecting Rehab Potential hx of vaginal deliveries, Fx of pelvis, immobilized for 3 months   PT Frequency 1x / week   PT Duration 12 weeks   PT Treatment/Interventions ADLs/Self Care Home Management;Aquatic Therapy;Biofeedback;Electrical Stimulation;Cryotherapy;Gait training;Neuromuscular re-education;Manual techniques;Energy conservation;Functional mobility training;Moist Heat;Therapeutic activities;Therapeutic exercise;Balance training;Other (comment)  modifications to fitness routine to minimize further pelvic floor dysfunction   PT  Next Visit Plan address DRA    PT Home Exercise Plan progress pelvic floor exercises, dynamic stabilization series   Consulted and Agree with Plan of Care Patient        Problem List Patient Active Problem List   Diagnosis Date Noted  . Osteopenia 03/17/2015  . H/O adenomatous polyp of colon 02/04/2015  . Anxiety 01/11/2015  . 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 Arthritis of hand, degenerative 01/11/2015  . Anxiety state 08/04/2014  . Mixed incontinence 08/03/2014  . LBP (low back pain) 03/12/2014  . Hemochromatosis 02/10/2014  . ALLERGIC RHINITIS CAUSE UNSPECIFIED 11/29/2010  . UNSPECIFIED OSTEOPOROSIS 11/29/2010  . HEMOCHROMATOSIS, HX OF 11/29/2010    Jerl Mina 03/22/2015, 4:55 PM  [place]  [facility] MAIN REHAB SERVICES [address] [place], [place], [address] Phone: [phone]   Fax:  [phone]      

## 2015-03-28 ENCOUNTER — Ambulatory Visit: Payer: Medicare Other | Admitting: Anesthesiology

## 2015-03-28 ENCOUNTER — Ambulatory Visit
Admission: RE | Admit: 2015-03-28 | Discharge: 2015-03-28 | Disposition: A | Discharge status: Home / Self Care | Payer: Medicare Other | Source: Ambulatory Visit | Attending: Unknown Physician Specialty | Admitting: Unknown Physician Specialty

## 2015-03-28 ENCOUNTER — Encounter: Payer: Self-pay | Admitting: *Deleted

## 2015-03-28 ENCOUNTER — Encounter
Admission: RE | Disposition: A | Discharge status: Home / Self Care | Payer: Self-pay | Source: Ambulatory Visit | Attending: Unknown Physician Specialty

## 2015-03-28 DIAGNOSIS — Z818 Family history of other mental and behavioral disorders: Secondary | ICD-10-CM | POA: Diagnosis not present

## 2015-03-28 DIAGNOSIS — Z79899 Other long term (current) drug therapy: Secondary | ICD-10-CM | POA: Insufficient documentation

## 2015-03-28 DIAGNOSIS — Z9889 Other specified postprocedural states: Secondary | ICD-10-CM | POA: Diagnosis not present

## 2015-03-28 DIAGNOSIS — M810 Age-related osteoporosis without current pathological fracture: Secondary | ICD-10-CM | POA: Insufficient documentation

## 2015-03-28 DIAGNOSIS — Z809 Family history of malignant neoplasm, unspecified: Secondary | ICD-10-CM | POA: Diagnosis not present

## 2015-03-28 DIAGNOSIS — M8580 Other specified disorders of bone density and structure, unspecified site: Secondary | ICD-10-CM | POA: Insufficient documentation

## 2015-03-28 DIAGNOSIS — Z86010 Personal history of colonic polyps: Secondary | ICD-10-CM | POA: Diagnosis present

## 2015-03-28 DIAGNOSIS — F329 Major depressive disorder, single episode, unspecified: Secondary | ICD-10-CM | POA: Insufficient documentation

## 2015-03-28 DIAGNOSIS — N3281 Overactive bladder: Secondary | ICD-10-CM | POA: Diagnosis not present

## 2015-03-28 DIAGNOSIS — K640 First degree hemorrhoids: Secondary | ICD-10-CM | POA: Diagnosis not present

## 2015-03-28 DIAGNOSIS — K219 Gastro-esophageal reflux disease without esophagitis: Secondary | ICD-10-CM | POA: Insufficient documentation

## 2015-03-28 DIAGNOSIS — D122 Benign neoplasm of ascending colon: Secondary | ICD-10-CM | POA: Diagnosis not present

## 2015-03-28 DIAGNOSIS — N189 Chronic kidney disease, unspecified: Secondary | ICD-10-CM | POA: Insufficient documentation

## 2015-03-28 DIAGNOSIS — K5730 Diverticulosis of large intestine without perforation or abscess without bleeding: Secondary | ICD-10-CM | POA: Insufficient documentation

## 2015-03-28 DIAGNOSIS — M19041 Primary osteoarthritis, right hand: Secondary | ICD-10-CM | POA: Diagnosis not present

## 2015-03-28 DIAGNOSIS — Z9849 Cataract extraction status, unspecified eye: Secondary | ICD-10-CM | POA: Insufficient documentation

## 2015-03-28 DIAGNOSIS — R32 Unspecified urinary incontinence: Secondary | ICD-10-CM | POA: Diagnosis not present

## 2015-03-28 DIAGNOSIS — E785 Hyperlipidemia, unspecified: Secondary | ICD-10-CM | POA: Diagnosis not present

## 2015-03-28 DIAGNOSIS — Z888 Allergy status to other drugs, medicaments and biological substances status: Secondary | ICD-10-CM | POA: Insufficient documentation

## 2015-03-28 DIAGNOSIS — M19042 Primary osteoarthritis, left hand: Secondary | ICD-10-CM | POA: Diagnosis not present

## 2015-03-28 DIAGNOSIS — D124 Benign neoplasm of descending colon: Secondary | ICD-10-CM | POA: Diagnosis not present

## 2015-03-28 HISTORY — PX: COLONOSCOPY WITH PROPOFOL: SHX5780

## 2015-03-28 SURGERY — COLONOSCOPY WITH PROPOFOL
Anesthesia: General

## 2015-03-28 MED ORDER — FENTANYL CITRATE (PF) 100 MCG/2ML IJ SOLN
INTRAMUSCULAR | Status: DC | PRN
  Administered 2015-03-28: 50 ug via INTRAVENOUS

## 2015-03-28 MED ORDER — PROPOFOL 10 MG/ML IV BOLUS
INTRAVENOUS | Status: DC | PRN
  Administered 2015-03-28: 30 mg via INTRAVENOUS
  Administered 2015-03-28: 50 mg via INTRAVENOUS

## 2015-03-28 MED ORDER — LACTATED RINGERS IV SOLN
INTRAVENOUS | Status: DC
  Administered 2015-03-28: 11:00:00 via INTRAVENOUS

## 2015-03-28 MED ORDER — EPHEDRINE SULFATE 50 MG/ML IJ SOLN
INTRAMUSCULAR | Status: DC | PRN
  Administered 2015-03-28 (×3): 5 mg via INTRAVENOUS
  Administered 2015-03-28: 10 mg via INTRAVENOUS

## 2015-03-28 MED ORDER — LIDOCAINE HCL (PF) 2 % IJ SOLN
INTRAMUSCULAR | Status: DC | PRN
  Administered 2015-03-28: 50 mg

## 2015-03-28 MED ORDER — PROPOFOL INFUSION 10 MG/ML OPTIME
INTRAVENOUS | Status: DC | PRN
  Administered 2015-03-28: 100 ug/kg/min via INTRAVENOUS

## 2015-03-28 NOTE — Anesthesia Preprocedure Evaluation (Signed)
Anesthesia Evaluation  Patient identified by MRN, date of birth, ID band Patient awake    Reviewed: Allergy & Precautions, H&P , NPO status , Patient's Chart, lab work & pertinent test results, reviewed documented beta blocker date and time   Airway Mallampati: II  TM Distance: >3 FB Neck ROM: full    Dental no notable dental hx.    Pulmonary neg pulmonary ROS,  breath sounds clear to auscultation  Pulmonary exam normal       Cardiovascular Exercise Tolerance: Good negative cardio ROS  Rhythm:regular Rate:Normal     Neuro/Psych  Neuromuscular disease negative neurological ROS  negative psych ROS   GI/Hepatic negative GI ROS, Neg liver ROS, GERD-  ,  Endo/Other  negative endocrine ROS  Renal/GU Renal diseasenegative Renal ROS  negative genitourinary   Musculoskeletal   Abdominal   Peds  Hematology negative hematology ROS (+) anemia ,   Anesthesia Other Findings   Reproductive/Obstetrics negative OB ROS                             Anesthesia Physical Anesthesia Plan  ASA: III  Anesthesia Plan: General   Post-op Pain Management:    Induction:   Airway Management Planned:   Additional Equipment:   Intra-op Plan:   Post-operative Plan:   Informed Consent: I have reviewed the patients History and Physical, chart, labs and discussed the procedure including the risks, benefits and alternatives for the proposed anesthesia with the patient or authorized representative who has indicated his/her understanding and acceptance.   Dental Advisory Given  Plan Discussed with: CRNA  Anesthesia Plan Comments:         Anesthesia Quick Evaluation

## 2015-03-28 NOTE — Op Note (Signed)
[facility] Gastroenterology Patient Name: Judith Kim Procedure Date: 03/28/2015 10:50 AM MRN: [id number] Account #: [id number] Date of Birth: 1936-01-29 Admit Type: Outpatient Age: 79 Room: [facility] ENDO ROOM 1 Gender: Female Note Status: Finalized Procedure:         Colonoscopy Indications:       Follow-up for history of adenomatous polyps in the colon Providers:         Manya Silvas, MD Referring MD:      Glendon Axe (Referring MD) Medicines:         Propofol per Anesthesia Complications:     No immediate complications. Procedure:         Pre-Anesthesia Assessment:                    - After reviewing the risks and benefits, the patient was                     deemed in satisfactory condition to undergo the procedure.                    After obtaining informed consent, the colonoscope was                     passed under direct vision. Throughout the procedure, the                     patient's blood pressure, pulse, and oxygen saturations                     were monitored continuously. The Colonoscope was                     introduced through the anus and advanced to the the cecum,                     identified by appendiceal orifice and ileocecal valve. The                     colonoscopy was performed without difficulty. The patient                     tolerated the procedure well. The quality of the bowel                     preparation was excellent. Findings:      Two sessile polyps were found in the descending colon and in the       ascending colon. The polyps were diminutive in size. These polyps were       removed with a jumbo cold forceps. Resection and retrieval were complete.      A few small-mouthed diverticula were found in the sigmoid colon.      Internal hemorrhoids were found during endoscopy. The hemorrhoids were       small and Grade I (internal hemorrhoids that do not prolapse).      The exam was otherwise without  abnormality. Impression:        - Two diminutive polyps in the descending colon and in the                     ascending colon. Resected and retrieved.                    - Diverticulosis in the sigmoid colon.                    -  Internal hemorrhoids.                    - The examination was otherwise normal. Recommendation:    - Await pathology results. Manya Silvas, MD 03/28/2015 11:28:19 AM This report has been signed electronically. Number of Addenda: 0 Note Initiated On: 03/28/2015 10:50 AM Scope Withdrawal Time: 0 hours 14 minutes 37 seconds  Total Procedure Duration: 0 hours 27 minutes 28 seconds       [facility] [employer] [facility]

## 2015-03-28 NOTE — H&P (Signed)
Primary Care Physician:  Glendon Axe, MD Primary Gastroenterologist:  Dr. Vira Agar  Pre-Procedure History & Physical: HPI:  Judith Kim is a 79 y.o. female is here for an colonoscopy.   Past Medical History  Diagnosis Date  . Stress incontinence   . Colon polyp   . Anemia   . Constipation   . Osteoarthritis of both hands   . Osteopenia   . Overactive bladder   . Hemochromatosis   . GERD (gastroesophageal reflux disease)   . H/O hematuria   . Osteoporosis   . Cataract cortical, senile   . Depression   . Chronic kidney disease   . Anxiety   . Incontinence in female   . Vaginal atrophy   . Hyperlipidemia     since 10/2013,  Dr. Candiss Norse has cleared her from meds and reported to her her levels are [facility].     Past Surgical History  Procedure Laterality Date  . Esophagogastroduodenoscopy    . Tonsillectomy    . Cataract extraction    . Colonoscopy    . Flexible sigmoidoscopy      Prior to Admission medications   Medication Sig Start Date End Date Taking? Authorizing Provider  calcium carbonate (OS-CAL - DOSED IN MG OF ELEMENTAL CALCIUM) 1250 (500 CA) MG tablet Take by mouth.    Historical Provider, MD  Cholecalciferol (VITAMIN D3) 5000 UNITS CAPS Take 1 capsule by mouth daily.    Historical Provider, MD  ciclopirox (PENLAC) 8 % solution Apply topically at bedtime. Apply over nail and surrounding skin. Apply daily over previous coat. After seven (7) days, may remove with alcohol and continue cycle.    Historical Provider, MD  Cyanocobalamin (VITAMIN B-12) 1000 MCG SUBL Take by mouth.    Historical Provider, MD  diphenhydrAMINE (BENADRYL) 50 MG capsule Take 50 mg by mouth every 6 (six) hours as needed.    Historical Provider, MD  docusate sodium (COLACE) 100 MG capsule Take 100 mg by mouth 2 (two) times daily.    Historical Provider, MD  estradiol (ESTRACE) 0.1 MG/GM vaginal cream Place 1 Applicatorful vaginally at bedtime.    Historical Provider, MD  Flaxseed, Linseed,  (FLAXSEED OIL) 1000 MG CAPS Take by mouth.    Historical Provider, MD  Multiple Vitamin (MULTI-VITAMINS) TABS Take by mouth.    Historical Provider, MD  Omega 3 1200 MG CAPS Take 1 capsule by mouth daily.    Historical Provider, MD  polycarbophil (FIBERCON) 625 MG tablet Take 625 mg by mouth daily.    Historical Provider, MD  polyethylene glycol powder (GLYCOLAX/MIRALAX) powder  02/04/15   Historical Provider, MD  Probiotic Product (PROBIOTIC & ACIDOPHILUS EX ST PO) Take by mouth.    Historical Provider, MD    Allergies as of 02/04/2015 - reviewed 11/29/2010  Allergen Reaction Noted  . Nitrofurantoin  11/29/2010    Family History  Problem Relation Age of Onset  . Bipolar disorder Father   . Cancer Mother     History   Social History  . Marital Status: Widowed    Spouse Name: N/A  . Number of Children: N/A  . Years of Education: N/A   Occupational History  . Not on file.   Social History Main Topics  . Smoking status: Never Smoker   . Smokeless tobacco: Not on file  . Alcohol Use: 21.0 oz/week    35 Glasses of wine per week  . Drug Use: No  . Sexual Activity: Not on file   Other Topics Concern  .  Not on file   Social History Narrative    Review of Systems: See HPI, otherwise negative ROS  Physical Exam: BP 114/63 mmHg  Pulse 61  Temp(Src) 97.1 F (36.2 C) (Oral)  Resp 20  Ht 5\' 6"  (1.676 m)  Wt 54.432 kg (120 lb)  BMI 19.38 kg/m2  SpO2 100% General:   Alert,  pleasant and cooperative in NAD Head:  Normocephalic and atraumatic. Neck:  Supple; no masses or thyromegaly. Lungs:  Clear throughout to auscultation.    Heart:  Regular rate and rhythm. Abdomen:  Soft, nontender and nondistended. Normal bowel sounds, without guarding, and without rebound.   Neurologic:  Alert and  oriented x4;  grossly normal neurologically.  Impression/Plan: Judith Kim is here for an colonoscopy to be performed for personal history of colon polyps  Risks, benefits,  limitations, and alternatives regarding  colonoscopy have been reviewed with the patient.  Questions have been answered.  All parties agreeable.   Gaylyn Cheers, MD  03/28/2015, 11:30 AM   Primary Care Physician:  Glendon Axe, MD Primary Gastroenterologist:  Dr. Vira Agar  Pre-Procedure History & Physical: HPI:  Judith Kim is a 79 y.o. female is here for an colonoscopy.   Past Medical History  Diagnosis Date  . Stress incontinence   . Colon polyp   . Anemia   . Constipation   . Osteoarthritis of both hands   . Osteopenia   . Overactive bladder   . Hemochromatosis   . GERD (gastroesophageal reflux disease)   . H/O hematuria   . Osteoporosis   . Cataract cortical, senile   . Depression   . Chronic kidney disease   . Anxiety   . Incontinence in female   . Vaginal atrophy   . Hyperlipidemia     since 10/2013,  Dr. Candiss Norse has cleared her from meds and reported to her her levels are [facility].     Past Surgical History  Procedure Laterality Date  . Esophagogastroduodenoscopy    . Tonsillectomy    . Cataract extraction    . Colonoscopy    . Flexible sigmoidoscopy      Prior to Admission medications   Medication Sig Start Date End Date Taking? Authorizing Provider  calcium carbonate (OS-CAL - DOSED IN MG OF ELEMENTAL CALCIUM) 1250 (500 CA) MG tablet Take by mouth.    Historical Provider, MD  Cholecalciferol (VITAMIN D3) 5000 UNITS CAPS Take 1 capsule by mouth daily.    Historical Provider, MD  ciclopirox (PENLAC) 8 % solution Apply topically at bedtime. Apply over nail and surrounding skin. Apply daily over previous coat. After seven (7) days, may remove with alcohol and continue cycle.    Historical Provider, MD  Cyanocobalamin (VITAMIN B-12) 1000 MCG SUBL Take by mouth.    Historical Provider, MD  diphenhydrAMINE (BENADRYL) 50 MG capsule Take 50 mg by mouth every 6 (six) hours as needed.    Historical Provider, MD  docusate sodium (COLACE) 100 MG capsule Take 100 mg by  mouth 2 (two) times daily.    Historical Provider, MD  estradiol (ESTRACE) 0.1 MG/GM vaginal cream Place 1 Applicatorful vaginally at bedtime.    Historical Provider, MD  Flaxseed, Linseed, (FLAXSEED OIL) 1000 MG CAPS Take by mouth.    Historical Provider, MD  Multiple Vitamin (MULTI-VITAMINS) TABS Take by mouth.    Historical Provider, MD  Omega 3 1200 MG CAPS Take 1 capsule by mouth daily.    Historical Provider, MD  polycarbophil (FIBERCON) 625 MG tablet Take  625 mg by mouth daily.    Historical Provider, MD  polyethylene glycol powder (GLYCOLAX/MIRALAX) powder  02/04/15   Historical Provider, MD  Probiotic Product (PROBIOTIC & ACIDOPHILUS EX ST PO) Take by mouth.    Historical Provider, MD    Allergies as of 02/04/2015 - reviewed 11/29/2010  Allergen Reaction Noted  . Nitrofurantoin  11/29/2010    Family History  Problem Relation Age of Onset  . Bipolar disorder Father   . Cancer Mother     History   Social History  . Marital Status: Widowed    Spouse Name: N/A  . Number of Children: N/A  . Years of Education: N/A   Occupational History  . Not on file.   Social History Main Topics  . Smoking status: Never Smoker   . Smokeless tobacco: Not on file  . Alcohol Use: 21.0 oz/week    35 Glasses of wine per week  . Drug Use: No  . Sexual Activity: Not on file   Other Topics Concern  . Not on file   Social History Narrative    Review of Systems: See HPI, otherwise negative ROS  Physical Exam: BP 114/63 mmHg  Pulse 61  Temp(Src) 97.1 F (36.2 C) (Oral)  Resp 20  Ht 5\' 6"  (1.676 m)  Wt 54.432 kg (120 lb)  BMI 19.38 kg/m2  SpO2 100% General:   Alert,  pleasant and cooperative in NAD Head:  Normocephalic and atraumatic. Neck:  Supple; no masses or thyromegaly. Lungs:  Clear throughout to auscultation.    Heart:  Regular rate and rhythm. Abdomen:  Soft, nontender and nondistended. Normal bowel sounds, without guarding, and without rebound.   Neurologic:  Alert  and  oriented x4;  grossly normal neurologically.  Impression/Plan: Judith Kim is here for an colonoscopy to be performed for personal history of colon polyps  Risks, benefits, limitations, and alternatives regarding  colonoscopy have been reviewed with the patient.  Questions have been answered.  All parties agreeable.   Gaylyn Cheers, MD  03/28/2015, 11:30 AM

## 2015-03-28 NOTE — Transfer of Care (Signed)
Immediate Anesthesia Transfer of Care Note  Patient: Judith Kim  Procedure(s) Performed: Procedure(s): COLONOSCOPY WITH PROPOFOL (N/A)  Patient Location: PACU  Anesthesia Type:General  Level of Consciousness: awake  Airway & Oxygen Therapy: Patient Spontanous Breathing  Post-op Assessment: Report given to RN and Post -op Vital signs reviewed and stable  Post vital signs: Reviewed and stable  Last Vitals:  Filed Vitals:   03/28/15 1024  BP: 114/63  Pulse: 61  Temp: 36.2 C  Resp: 20    Complications: No apparent anesthesia complications

## 2015-03-29 LAB — SURGICAL PATHOLOGY

## 2015-03-29 NOTE — Anesthesia Postprocedure Evaluation (Signed)
  Anesthesia Post-op Note  Patient: Judith Kim  Procedure(s) Performed: Procedure(s): COLONOSCOPY WITH PROPOFOL (N/A)  Anesthesia type:General  Patient location: PACU  Post pain: Pain level controlled  Post assessment: Post-op Vital signs reviewed, Patient's Cardiovascular Status Stable, Respiratory Function Stable, Patent Airway and No signs of Nausea or vomiting  Post vital signs: Reviewed and stable  Last Vitals:  Filed Vitals:   03/28/15 1200  BP: 107/65  Pulse: 62  Temp:   Resp: 19    Level of consciousness: awake, alert  and patient cooperative  Complications: No apparent anesthesia complications

## 2015-04-03 NOTE — Progress Notes (Signed)
[place]  Telephone:([phone] [phone]) [phone]  ID: Judith Kim OB: [birth year]  MR#: [id number]  MBT#:[id number]  Patient Care Team: Glendon Axe, MD as PCP - General (Internal Medicine)  CHIEF COMPLAINT:  Chief Complaint  Patient presents with  . Follow-up    INTERVAL HISTORY: Patient returns to clinic today for repeat laboratory work, further evaluation, and consideration of additional phlebotomy. She continues to feel well and is asymptomatic.  She has no neurologic complaints.  She has had no recent fevers or illnesses.  She has a good appetite and denies weight loss.  She has no chest pain or shortness of breath.  She denies any nausea, vomiting, constipation, or diarrhea.  She has no urinary complaints.  Patient offers no specific complaints today.   REVIEW OF SYSTEMS:   Review of Systems  Constitutional: Negative.     As per HPI. Otherwise, a complete review of systems is negatve.  PAST MEDICAL HISTORY: Past Medical History  Diagnosis Date  . Stress incontinence   . Colon polyp   . Anemia   . Constipation   . Osteoarthritis of both hands   . Osteopenia   . Overactive bladder   . Hemochromatosis   . GERD (gastroesophageal reflux disease)   . H/O hematuria   . Osteoporosis   . Cataract cortical, senile   . Depression   . Chronic kidney disease   . Anxiety   . Incontinence in female   . Vaginal atrophy   . Hyperlipidemia     since 10/2013,  Dr. Candiss Norse has cleared her from meds and reported to her her levels are [facility].     PAST SURGICAL HISTORY: Past Surgical History  Procedure Laterality Date  . Esophagogastroduodenoscopy    . Tonsillectomy    . Cataract extraction    . Colonoscopy    . Flexible sigmoidoscopy    . Colonoscopy with propofol N/A 03/28/2015    Procedure: COLONOSCOPY WITH PROPOFOL;  Surgeon: Manya Silvas, MD;  Location: [facility] ENDOSCOPY;  Service: Endoscopy;  Laterality: N/A;    FAMILY HISTORY Family  History  Problem Relation Age of Onset  . Bipolar disorder Father   . Cancer Mother        ADVANCED DIRECTIVES:    HEALTH MAINTENANCE: History  Substance Use Topics  . Smoking status: Never Smoker   . Smokeless tobacco: Not on file  . Alcohol Use: 21.0 oz/week    35 Glasses of wine per week     Colonoscopy:  PAP:  Bone density:  Lipid panel:  Allergies  Allergen Reactions  . Nitrofurantoin     Current Outpatient Prescriptions  Medication Sig Dispense Refill  . Cholecalciferol (VITAMIN D3) 5000 UNITS CAPS Take 1 capsule by mouth daily.    . ciclopirox (PENLAC) 8 % solution Apply topically at bedtime. Apply over nail and surrounding skin. Apply daily over previous coat. After seven (7) days, may remove with alcohol and continue cycle.    . diphenhydrAMINE (BENADRYL) 50 MG capsule Take 50 mg by mouth every 6 (six) hours as needed.    . docusate sodium (COLACE) 100 MG capsule Take 100 mg by mouth 2 (two) times daily.    Marland Kitchen estradiol (ESTRACE) 0.1 MG/GM vaginal cream Place 1 Applicatorful vaginally at bedtime.    . Omega 3 1200 MG CAPS Take 1 capsule by mouth daily.    . polycarbophil (FIBERCON) 625 MG tablet Take 625 mg by mouth daily.    . calcium carbonate (OS-CAL - DOSED  IN MG OF ELEMENTAL CALCIUM) 1250 (500 CA) MG tablet Take by mouth.    . Cyanocobalamin (VITAMIN B-12) 1000 MCG SUBL Take by mouth.    . Flaxseed, Linseed, (FLAXSEED OIL) 1000 MG CAPS Take by mouth.    . Multiple Vitamin (MULTI-VITAMINS) TABS Take by mouth.    . polyethylene glycol powder (GLYCOLAX/MIRALAX) powder     . Probiotic Product (PROBIOTIC & ACIDOPHILUS EX ST PO) Take by mouth.     No current facility-administered medications for this visit.    OBJECTIVE: Filed Vitals:   03/15/15 1657  BP: 115/60  Pulse: 75  Temp: 98.2 F (36.8 C)  Resp: 20     Body mass index is 20.26 kg/(m^2).    ECOG FS:0 - Asymptomatic  General: Well-developed, well-nourished, no acute distress. Eyes: anicteric  sclera. Lungs: Clear to auscultation bilaterally. Heart: Regular rate and rhythm. No rubs, murmurs, or gallops. Abdomen: Soft, nontender, nondistended. No organomegaly noted, normoactive bowel sounds. Musculoskeletal: No edema, cyanosis, or clubbing. Neuro: Alert, answering all questions appropriately. Cranial nerves grossly intact. Skin: No rashes or petechiae noted. Psych: Normal affect.   LAB RESULTS:  Lab Results  Component Value Date   PROT 7.0 06/14/2014   ALBUMIN 4.0 06/14/2014   AST 17 06/14/2014   ALT 20 06/14/2014   ALKPHOS 83 06/14/2014    Lab Results  Component Value Date   WBC 5.8 03/15/2015   NEUTROABS 3.7 03/15/2015   HGB 12.8 03/15/2015   HCT 38.2 03/15/2015   MCV 97.2 03/15/2015   PLT 322 03/15/2015     STUDIES: No results found.  ASSESSMENT: Hemochromatosis.  PLAN:    1. Hemachromatatosis: Patient's ferritin is 86 today. Her goal is approximately 50, therefore she will receive 200 mL phlebotomy again today.  It appears she only needs phlebotomy approximately every 3 months.  Patient will return to clinic in 3 months for repeat laboratory work and consideration of phlebotomy.  2. Hemoglobin: Within normal limits. Continue to monitor while undergoing phlebotomy.  Patient expressed understanding and was in agreement with this plan. She also understands that She can call clinic at any time with any questions, concerns, or complaints.    Lloyd Huger, MD   04/03/2015 5:24 PM

## 2015-05-10 ENCOUNTER — Ambulatory Visit (INDEPENDENT_AMBULATORY_CARE_PROVIDER_SITE_OTHER): Payer: Medicare Other | Admitting: Urology

## 2015-05-10 VITALS — BP 125/82 | HR 80 | Ht 66.0 in | Wt 124.4 lb

## 2015-05-10 DIAGNOSIS — R3129 Other microscopic hematuria: Secondary | ICD-10-CM

## 2015-05-10 DIAGNOSIS — R312 Other microscopic hematuria: Secondary | ICD-10-CM | POA: Diagnosis not present

## 2015-05-10 LAB — MICROSCOPIC EXAMINATION: Bacteria, UA: NONE SEEN

## 2015-05-10 LAB — URINALYSIS, COMPLETE
Bilirubin, UA: NEGATIVE
GLUCOSE, UA: NEGATIVE
Nitrite, UA: NEGATIVE
Protein, UA: NEGATIVE
Specific Gravity, UA: 1.015 (ref 1.005–1.030)
UUROB: 0.2 mg/dL (ref 0.2–1.0)
pH, UA: 7 (ref 5.0–7.5)

## 2015-05-10 MED ORDER — LIDOCAINE HCL 2 % EX GEL
1.0000 "application " | Freq: Once | CUTANEOUS | Status: AC
  Administered 2015-05-10: 1 via URETHRAL

## 2015-05-10 MED ORDER — CIPROFLOXACIN HCL 500 MG PO TABS
500.0000 mg | ORAL_TABLET | Freq: Once | ORAL | Status: AC
  Administered 2015-05-10: 500 mg via ORAL

## 2015-05-10 NOTE — Progress Notes (Deleted)
5:[place] Nov 17, 1935 [id number]  Referring provider: Glendon Axe, MD [place], [place] [address]  No chief complaint on file.   HPI: This is a 79 year old female who presents today in follow-up for urinary incontinence. The patient also was noted to have microscopic hematuria. Incontinence: The patient states that her began approximately 3 years ago. She reportedly had a pelvic fracture about one year ago and had significant progression of her urinary incontinence. Predominantly she describes leakage with bending over, coughing, sneezing, and walking. She denies any symptoms of urgency or urge incontinence. She goes through 2 pads per day. She finds that if she voids every 2 hours her symptoms are not quite as bad. She was treated with 25 mg of Myrbetriq daily which has not had any significant improvement. She is also seen and evaluated by pelvic floor physical therapy and has undergone 3's of 4 office visit. The patient states that she has a fairly weak stream, often has to strain to void, and describes to me what sounds like Valsalva voiding.  However, she does feels that she empties her bladder completely. The patient also struggles with constipation. She denies any pelvic pain. She has no history of dyspareunia, however has been sexually inactive for over 10 years.  The patient has no neurologic history and no significant GYN/urologic past medical history. She maintains her uterus and ovaries. She's had 2 vaginal deliveries. She denies any history of prolapse-like symptoms.   Microscopic Hematuria: The patient was also noted to have microscopic hematuria at her last follow-up visit. The patient tells me that this is been documented over the past 15 years. It was achieved due to vaginal atrophy. She states that she has had a complete workup although this is been years. She did have a abdominal/pelvis CT scan about 1 year prior when she fractured her  pelvis. Unfortunately, these images are in [place]. She does relate that they did not tell her anything about stones or large renal masses.the patient denies a history of recurrent urinary tract infections. She denies any dysuria. She denies any flank pain. She has no history of kidney stones.  Intv:  Patient returns for follow-up.  She underwent Urodynamics as well as a renal u/s.  Results below.  renal ultrasound 05/09/15: the right kidney measures 10.73 cm in length with a cortical thickness of 1.24cm.  There is an extrarenal pelvis (mild dilation) with no evidence of stones, mass or significant hydronephrosis. The left kidney measures 10.61 cm in length with a cortical thickness of 1.48cm.  There are no mass, stones, or hydronephrosis. Bladder is normal appearing - PVR 21.55cc.  UDS 05/09/15: PVR - 27cc No instability to 457cc.  Normal sensation.  VLPP - 79cm H20 with 200cc vol, 54cmH20 with 300cc vol, 39cm H20 w 400cc vol. Weak detrusor contraction.   Straining to void flow rate was 48mL/sec, without straining flow rate was 2 ml/sec Imp: Moderate/severe intrinsic sphincter deficiency, poor detrusor contraction, no OAB   PMH: Past Medical History  Diagnosis Date  . Stress incontinence   . Colon polyp   . Anemia   . Constipation   . Osteoarthritis of both hands   . Osteopenia   . Overactive bladder   . Hemochromatosis   . GERD (gastroesophageal reflux disease)   . H/O hematuria   . Osteoporosis   . Cataract cortical, senile   . Depression   . Chronic kidney disease   . Anxiety   .  Incontinence in female   . Vaginal atrophy   . Hyperlipidemia     since 10/2013,  Dr. Candiss Norse has cleared her from meds and reported to her her levels are [facility]     Surgical History: Past Surgical History  Procedure Laterality Date  . Esophagogastroduodenoscopy    . Tonsillectomy    . Cataract extraction    . Colonoscopy    . Flexible sigmoidoscopy    . Colonoscopy with propofol N/A 03/28/2015     Procedure: COLONOSCOPY WITH PROPOFOL;  Surgeon: Manya Silvas, MD;  Location: [facility] ENDOSCOPY;  Service: Endoscopy;  Laterality: N/A;    Home Medications:    Medication List       This list is accurate as of: 05/10/15  5:27 AM.  Always use your most recent med list.               calcium carbonate 1250 (500 CA) MG tablet  Commonly known as:  OS-CAL - dosed in mg of elemental calcium  Take by mouth.     ciclopirox 8 % solution  Commonly known as:  PENLAC  Apply topically at bedtime. Apply over nail and surrounding skin. Apply daily over previous coat. After seven (7) days, may remove with alcohol and continue cycle.     diphenhydrAMINE 50 MG capsule  Commonly known as:  BENADRYL  Take 50 mg by mouth every 6 (six) hours as needed.     docusate sodium 100 MG capsule  Commonly known as:  COLACE  Take 100 mg by mouth 2 (two) times daily.     estradiol 0.1 MG/GM vaginal cream  Commonly known as:  ESTRACE  Place 1 Applicatorful vaginally at bedtime.     Flaxseed Oil 1000 MG Caps  Take by mouth.     MULTI-VITAMINS Tabs  Take by mouth.     Omega 3 1200 MG Caps  Take 1 capsule by mouth daily.     polycarbophil 625 MG tablet  Commonly known as:  FIBERCON  Take 625 mg by mouth daily.     polyethylene glycol powder powder  Commonly known as:  GLYCOLAX/MIRALAX     PROBIOTIC & ACIDOPHILUS EX ST PO  Take by mouth.     Vitamin B-12 1000 MCG Subl  Take by mouth.     Vitamin D3 5000 UNITS Caps  Take 1 capsule by mouth daily.        Allergies:  Allergies  Allergen Reactions  . Nitrofurantoin     Family History: Family History  Problem Relation Age of Onset  . Bipolar disorder Father   . Cancer Mother     Social History:  reports that she has never smoked. She does not have any smokeless tobacco history on file. She reports that she drinks about 21.0 oz of alcohol per week. She reports that she does not use illicit drugs.  Physical Exam: There were no vitals  taken for this visit.  Constitutional:  Alert and oriented, No acute distress. Neurologic: Grossly intact, no focal deficits, moving all 4 extremities. Psychiatric: Normal mood and affect.  Laboratory Data: Lab Results  Component Value Date   WBC 5.8 03/15/2015   HGB 12.8 03/15/2015   HCT 38.2 03/15/2015   MCV 97.2 03/15/2015   PLT 322 03/15/2015    No results found for: CREATININE  No results found for: PSA  No results found for: TESTOSTERONE  No results found for: HGBA1C  Urinalysis    Component Value Date/Time   GLUCOSEU Negative  03/17/2015 1011   BILIRUBINUR Negative 03/17/2015 1011   NITRITE Negative 03/17/2015 1011   LEUKOCYTESUR Negative 03/17/2015 1011    Pertinent Imaging: none  Assessment & Plan:    1. Microscopic hematuria Our plan is to obtain a renal ultrasound between now and her follow-up visit. At her follow-up visit we'll plan to perform cystoscopy.  2. Stress incontinence, female The patient almost certainly has predominantly stress urinary incontinence. She denies any real history of urgency or urge incontinence. As such, I stopped Myrbetric. She does however have a interesting finding of a week stream with Valsalva voiding. I wonder if this is a result of her prior pelvic fracture. We discussed treatment modalities including ongoing pelvic floor physical therapy which she does not feel particularly has been helpful. We also discussed performing a mid urethral sling or perhaps urethral bulking procedure. However, I think given the uncertainty of her detrusor function I would like to obtain urodynamics prior.   No Follow-up on file.  Ardis Hughs, [facility] [address], [place], [place] [address] [phone]   This encounter was created in error - please disregard.

## 2015-05-10 NOTE — Progress Notes (Signed)
    Cystoscopy Procedure Note  Patient identification was confirmed, informed consent was obtained, and patient was prepped using Betadine solution.  Lidocaine jelly was administered per urethral meatus.    Preoperative abx where received prior to procedure.    Procedure: - Flexible cystoscope introduced, without any difficulty.   - Thorough search of the bladder revealed:    normal urethral meatus    normal urothelium    no stones    no ulcers     no tumors    no urethral polyps    no trabeculation  - Ureteral orifices were normal in position and appearance.  Post-Procedure: - Patient tolerated the procedure well   

## 2015-05-10 NOTE — Progress Notes (Signed)
5:[place] 1935/12/06 [id number]  Referring provider: Glendon Axe, MD [place], [place] [address]  No chief complaint on file.   HPI: This is a 79 year old female who presents today in follow-up for urinary incontinence. The patient also was noted to have microscopic hematuria. Incontinence: The patient states that her began approximately 3 years ago. She reportedly had a pelvic fracture about one year ago and had significant progression of her urinary incontinence. Predominantly she describes leakage with bending over, coughing, sneezing, and walking. She denies any symptoms of urgency or urge incontinence. She goes through 2 pads per day. She finds that if she voids every 2 hours her symptoms are not quite as bad. She was treated with 25 mg of Myrbetriq daily which has not had any significant improvement. She is also seen and evaluated by pelvic floor physical therapy and has undergone 3's of 4 office visit. The patient states that she has a fairly weak stream, often has to strain to void, and describes to me what sounds like Valsalva voiding.  However, she does feels that she empties her bladder completely. The patient also struggles with constipation. She denies any pelvic pain. She has no history of dyspareunia, however has been sexually inactive for over 10 years.  The patient has no neurologic history and no significant GYN/urologic past medical history. She maintains her uterus and ovaries. She's had 2 vaginal deliveries. She denies any history of prolapse-like symptoms.   Microscopic Hematuria: The patient was also noted to have microscopic hematuria at her last follow-up visit. The patient tells me that this is been documented over the past 15 years. It was achieved due to vaginal atrophy. She states that she has had a complete workup although this is been years. She did have a abdominal/pelvis CT scan about 1 year prior when she fractured her  pelvis. Unfortunately, these images are in [place]. She does relate that they did not tell her anything about stones or large renal masses.the patient denies a history of recurrent urinary tract infections. She denies any dysuria. She denies any flank pain. She has no history of kidney stones.  Intv:  Patient returns for follow-up.  She underwent Urodynamics as well as a renal u/s.  Results below.  No changes to her bowel movements, neurological changes, or progressive voiding symptoms.  renal ultrasound 05/09/15: the right kidney measures 10.73 cm in length with a cortical thickness of 1.24cm.  There is an extrarenal pelvis (mild dilation) with no evidence of stones, mass or significant hydronephrosis. The left kidney measures 10.61 cm in length with a cortical thickness of 1.48cm.  There are no mass, stones, or hydronephrosis. Bladder is normal appearing - PVR 21.55cc.  UDS 05/09/15: PVR - 27cc No instability to 457cc.  Normal sensation.  VLPP - 79cm H20 with 200cc vol, 54cmH20 with 300cc vol, 39cm H20 w 400cc vol. Weak detrusor contraction.   Straining to void flow rate was 47mL/sec, without straining flow rate was 2 ml/sec Imp: Moderate/severe intrinsic sphincter deficiency, poor detrusor contraction, no OAB   PMH: Past Medical History  Diagnosis Date  . Stress incontinence   . Colon polyp   . Anemia   . Constipation   . Osteoarthritis of both hands   . Osteopenia   . Overactive bladder   . Hemochromatosis   . GERD (gastroesophageal reflux disease)   . H/O hematuria   . Osteoporosis   . Cataract cortical, senile   .  Depression   . Chronic kidney disease   . Anxiety   . Incontinence in female   . Vaginal atrophy   . Hyperlipidemia     since 10/2013,  Dr. Candiss Norse has cleared her from meds and reported to her her levels are [facility].     Surgical History: Past Surgical History  Procedure Laterality Date  . Esophagogastroduodenoscopy    . Tonsillectomy    . Cataract extraction    .  Colonoscopy    . Flexible sigmoidoscopy    . Colonoscopy with propofol N/A 03/28/2015    Procedure: COLONOSCOPY WITH PROPOFOL;  Surgeon: Manya Silvas, MD;  Location: [facility] ENDOSCOPY;  Service: Endoscopy;  Laterality: N/A;    Home Medications:    Medication List       This list is accurate as of: 05/10/15  5:27 AM.  Always use your most recent med list.               calcium carbonate 1250 (500 CA) MG tablet  Commonly known as:  OS-CAL - dosed in mg of elemental calcium  Take by mouth.     ciclopirox 8 % solution  Commonly known as:  PENLAC  Apply topically at bedtime. Apply over nail and surrounding skin. Apply daily over previous coat. After seven (7) days, may remove with alcohol and continue cycle.     diphenhydrAMINE 50 MG capsule  Commonly known as:  BENADRYL  Take 50 mg by mouth every 6 (six) hours as needed.     docusate sodium 100 MG capsule  Commonly known as:  COLACE  Take 100 mg by mouth 2 (two) times daily.     estradiol 0.1 MG/GM vaginal cream  Commonly known as:  ESTRACE  Place 1 Applicatorful vaginally at bedtime.     Flaxseed Oil 1000 MG Caps  Take by mouth.     MULTI-VITAMINS Tabs  Take by mouth.     Omega 3 1200 MG Caps  Take 1 capsule by mouth daily.     polycarbophil 625 MG tablet  Commonly known as:  FIBERCON  Take 625 mg by mouth daily.     polyethylene glycol powder powder  Commonly known as:  GLYCOLAX/MIRALAX     PROBIOTIC & ACIDOPHILUS EX ST PO  Take by mouth.     Vitamin B-12 1000 MCG Subl  Take by mouth.     Vitamin D3 5000 UNITS Caps  Take 1 capsule by mouth daily.        Allergies:  Allergies  Allergen Reactions  . Nitrofurantoin     Family History: Family History  Problem Relation Age of Onset  . Bipolar disorder Father   . Cancer Mother     Social History:  reports that she has never smoked. She does not have any smokeless tobacco history on file. She reports that she drinks about 21.0 oz of alcohol per week.  She reports that she does not use illicit drugs.  Physical Exam: There were no vitals taken for this visit.  Constitutional:  Alert and oriented, No acute distress. Neurologic: Grossly intact, no focal deficits, moving all 4 extremities. Psychiatric: Normal mood and affect.  Laboratory Data: Lab Results  Component Value Date   WBC 5.8 03/15/2015   HGB 12.8 03/15/2015   HCT 38.2 03/15/2015   MCV 97.2 03/15/2015   PLT 322 03/15/2015    No results found for: CREATININE  No results found for: PSA  No results found for: TESTOSTERONE  No results found  for: HGBA1C  Urinalysis    Component Value Date/Time   GLUCOSEU Negative 03/17/2015 1011   BILIRUBINUR Negative 03/17/2015 1011   NITRITE Negative 03/17/2015 1011   LEUKOCYTESUR Negative 03/17/2015 1011    Pertinent Imaging: The patient's renal ultrasound was essentially normal, there was mild hydronephrosis on the right. Left kidney was normal, the bladder was normal appearing.  Cystoscopy: Please see the separate note for additional documentation. However, it was normal.  Assessment & Plan:    1. Microscopic hematuria The patient's hematuria evaluation was essentially normal, she did have some mild hydronephrosis on the right, suspect this is insignificant. However, I would like to compare these images to her CT scan that was performed in May 2015. I have asked for these records to be sent to the office. Once we review the records we will then established whether not she needs additional imaging or further workup. However, did reassure the patient that this was most likely benign and clinically insignificant problem. I did give the patient follow-up precautions and encouraged her to follow up with me closely if she developed progression of her urinary tract symptoms.  2. Stress incontinence, female The patient has intrinsic sphincter deficiency based on her urodynamic evaluation. However, she also has a poor detrusor squeeze.  We discussed treatment options which include continued pelvic floor physical therapy or a mid urethral sling with the possibility of needing to perform CIC. The patient is not interested in idea of needing to do a lifelong clean intermittent catheterization in order to be dry. As such, the patient would like to continue with her current circumstance. As relates to her stress incontinence, she needs no additional treatment, we will readdress this in the future if she becomes more bothered by her symptoms.   Ardis Hughs, [facility] [address] [place], [place] [address] [phone]

## 2015-05-10 NOTE — Patient Instructions (Addendum)
Will obtain records from pelvic fracture in May 2015 - most important to [place] is the Abdominal CT scan. Will review records to determine if additional follow-up is necessary.

## 2015-05-14 ENCOUNTER — Encounter: Payer: Self-pay | Admitting: Urology

## 2015-05-17 ENCOUNTER — Telehealth: Payer: Self-pay

## 2015-05-17 NOTE — Telephone Encounter (Signed)
Pt sent a message via my chart wanting to know if you think she will benefit from pelvic floor PT. Pt states she went 4x in June, but would open to going again or a different tx. Please advise.

## 2015-05-18 NOTE — Telephone Encounter (Signed)
Pt stated she would like to try pelvic floor PT and she would prefer to go to [facility].

## 2015-05-19 ENCOUNTER — Other Ambulatory Visit: Payer: Self-pay | Admitting: Urology

## 2015-05-19 DIAGNOSIS — N3946 Mixed incontinence: Secondary | ICD-10-CM

## 2015-05-23 ENCOUNTER — Ambulatory Visit: Payer: Medicare Other | Attending: Urology | Admitting: Physical Therapy

## 2015-05-23 DIAGNOSIS — R278 Other lack of coordination: Secondary | ICD-10-CM | POA: Diagnosis present

## 2015-05-23 DIAGNOSIS — N8189 Other female genital prolapse: Secondary | ICD-10-CM | POA: Insufficient documentation

## 2015-05-24 NOTE — Therapy (Deleted)
[place] [facility] SERVICES [address] [place], [place], [address] Phone: [phone]   Fax:  [phone]  Patient Details  Name: PHILLIPPA STRAUB MRN: [id number] Date of Birth: 12-20-1935 Referring Provider:  Ardis Hughs, MD  Encounter Date: 05/23/2015   Jerl Mina 05/24/2015, 4:51 PM  [place] [facility] [address] [place], [place], [address] Phone: [phone]   Fax:  [phone]

## 2015-05-30 NOTE — Patient Instructions (Signed)
PELVIC FLOOR / KEGEL EXERCISES   Pelvic floor/ Kegel exercises are used to strengthen the muscles in the base of your pelvis that are responsible for supporting your pelvic organs and preventing urine/feces leakage. Based on your therapist's recommendations, they can be performed while standing, sitting, or lying down. Imagine pelvic floor area as a diamond with pelvic landmarks: top =pubic bone, bottom tip=tailbone, sides=sitting bones (ischial tuberosities).    Make yourself aware of this muscle group by using these cues while coordinating your breath:  Inhale, feel pelvic floor diamond area lower like hammock towards your feet and ribcage/belly expanding. Pause. Let the exhale naturally and feel your belly sink, abdominal muscles hugging in around you and you may notice the pelvic diamond draws upward towards your head forming a umbrella shape. Give a squeeze during the exhalation like you are stopping the flow of urine. If you are squeezing the buttock muscles, try to give 50% less effort.   Common Errors:  Breath holding: If you are holding your breath, you may be bearing down against your bladder instead of pulling it up. If you belly bulges up while you are squeezing, you are holding your breath. Be sure to breathe gently in and out while exercising. Counting out loud may help you avoid holding your breath.  Accessory muscle use: You should not see or feel other muscle movement when performing pelvic floor exercises. When done properly, no one can tell that you are performing the exercises. Keep the buttocks, belly and inner thighs relaxed.  Overdoing it: Your muscles can fatigue and stop working for you if you over-exercise. You may actually leak more or feel soreness at the lower abdomen or rectum.  YOUR HOME EXERCISE PROGRAM  LONG HOLDS: Position: on back with pillow under hips   Inhale and then exhale. Then squeeze the muscle and count aloud for 10 seconds. Rest with three long  breaths. (Be sure to let belly sink in with exhales and not push outward)  Perform 5 repetitions, 3 times/day  SHORT HOLDS: Position: on back with pillow under hips   Inhale and then exhale. Then squeeze the muscle.  (Be sure to let belly sink in with exhales and not push outward)  Perform 5 repetitions, 5  Times/day                      DECREASE DOWNWARD PRESSURE ON  YOUR PELVIC FLOOR, ABDOMINAL, LOW BACK MUSCLES       PRESERVE YOUR PELVIC HEALTH LONG-TERM   ** SQUEEZE pelvic floor BEFORE YOUR SNEEZE, COUGH, LAUGH   ** EXHALE BEFORE YOU RISE AGAINST GRAVITY (lifting, sit to stand, from squat to stand)   ** LOG ROLL OUT OF BED INSTEAD OF CRUNCH/SIT-UP

## 2015-05-30 NOTE — Therapy (Addendum)
[place] MAIN [facility] SERVICES [address] [place], [place], [address] Phone: [phone]   Fax:  [phone]  Physical Therapy Evaluation  Patient Details  Name: Judith Kim MRN: [id number] Date of Birth: 1935-12-10 Referring Provider:  Ardis Hughs, MD  Encounter Date: 05/23/2015      PT End of Session - 05/30/15 0845    Visit Number 5   Number of Visits 12   PT Start Time 1600   PT Stop Time 1700   PT Time Calculation (min) 60 min   Activity Tolerance Patient tolerated treatment well   Behavior During Therapy [facility] for tasks assessed/performed      Past Medical History  Diagnosis Date  . Stress incontinence   . Colon polyp   . Anemia   . Constipation   . Osteoarthritis of both hands   . Osteopenia   . Overactive bladder   . Hemochromatosis   . GERD (gastroesophageal reflux disease)   . H/O hematuria   . Osteoporosis   . Cataract cortical, senile   . Depression   . Chronic kidney disease   . Anxiety   . Incontinence in female   . Vaginal atrophy   . Hyperlipidemia     since 10/2013,  Dr. Candiss Norse has cleared her from meds and reported to her her levels are [facility].     Past Surgical History  Procedure Laterality Date  . Esophagogastroduodenoscopy    . Tonsillectomy    . Cataract extraction    . Colonoscopy    . Flexible sigmoidoscopy    . Colonoscopy with propofol N/A 03/28/2015    Procedure: COLONOSCOPY WITH PROPOFOL;  Surgeon: Manya Silvas, MD;  Location: [facility] ENDOSCOPY;  Service: Endoscopy;  Laterality: N/A;    There were no vitals filed for this visit.  Visit Diagnosis:  Coordination abnormal - Plan: PT plan of care cert/re-cert  Pelvic floor weakness - Plan: PT plan of care cert/re-cert      Subjective Assessment - 05/30/15 0820    Subjective Pt returns to PT after 3 months. Pt completed 4 sessions of PT in June and had to go on a month long trip. During that time, she performed her HEP dynamic  stabilization exercises and eliminated caffeine from her diet. Pt reports her  as she reports her urinary incontinence has improved by 40% and based on GROC she feels her condition has improved "moderately better".  Pt states she used to leak when walking to/from parking lot but now she can go for about 2 hrs walking on levelled ground before leaking. Pt is switching to sanitary pads this week because her panty liners feel damp by 5 pm. with increased activities outside the house that involve walking, errand running, hiking. Pt is interested in learning more pelvic floor exercises to address her concerns. Pt would like to see improvements with leakage when hiking various elevations. Pt will be going on a 4hr hike next week with 2 hr bathroom access.       Pertinent History Hx of 2 vaginal deliveries, denied pain. Hx of pelvic  Fx occured on sacrum where pt notices pressure sensation.    Patient Stated Goals decrease leakage with hiking    Currently in Pain? No/denies            [facility] PT Assessment - 05/30/15 0829    Observation/Other Assessments   Other Surveys  --  70.5% IOQ-L  (higher % indicated better function)    Palpation  SI assessment  coccyx medially aligned, pelvic symmetries noted                 Pelvic Floor Special Questions - June 03, 2015 0830    Pelvic Floor Internal Exam pt consented and no contraindications noted   Exam Type Vaginal   Palpation demo'd accessory mm activation   Strength good squeeze, good lift, able to hold agaisnt strong resistance   Strength # of reps 5  3/5 w/ bladder more dorsal without hips propped, 4/5 propped   Strength # of seconds 5   Biofeedback cues for decrease use of glut/add mm,                   PT Education - Jun 03, 2015 0844    Education provided Yes   Education Details HEP, POC.progression of pelvic floor exercises    Person(s) Educated Patient   Methods Explanation;Demonstration;Tactile cues;Verbal cues    Comprehension Verbalized understanding             PT Long Term Goals - June 03, 2015 0855    PT LONG TERM GOAL #1   Title Pt will increase her IOQ-L score from 70.5% to > 85% in order to demo impoved QOL.    Time 12   Period Weeks   Status New   PT LONG TERM GOAL #2   Title Pt will demo Grade 01/08/09/10 without pillow propped under hips in order to progress to upright pelvic floor exercises. .    Period Weeks   Status New   PT LONG TERM GOAL #3   Title Pt will report no leakage with trampoline jumping for 30 sec x 3 reps in order to improve SUI and be able to hike without leakage.    PT LONG TERM GOAL #4   Title Pt will be compliant with HEP    Time 12   Period Weeks   Status New               Plan - 2015-06-03 0845    Clinical Impression Statement Pt is a 79 yo female whose S & Sx consist of urinary incontinence, pelvic floor weakness, poor coordination of deep core mm, and decreased fascial tensigrity in the abominopelvic area. Pt completed 4 sessions of PT 3 months ago and has shown compliance with her exercises. Pt's previous IOQ-L score was 56.8% while today was 70.6%  Pt would continue to benefit from skilled PT.  Pt's current deficits impact her QOL as she depends on padwear and has to change pads on hiking trips.    Pt will benefit from skilled therapeutic intervention in order to improve on the following deficits Decreased mobility;Postural dysfunction;Decreased strength;Improper body mechanics;Decreased safety awareness;Decreased endurance;Decreased coordination;Decreased range of motion;Decreased balance;Decreased activity tolerance;Impaired sensation;Increased fascial restricitons   Rehab Potential Good   Clinical Impairments Affecting Rehab Potential hx of vaginal deliveries, Fx of pelvis, immobilized for 3 months   PT Frequency 1x / week   PT Duration 12 weeks   PT Treatment/Interventions ADLs/Self Care Home Management;Aquatic Therapy;Biofeedback;Electrical  Stimulation;Cryotherapy;Gait training;Neuromuscular re-education;Manual techniques;Energy conservation;Functional mobility training;Moist Heat;Therapeutic activities;Therapeutic exercise;Balance training;Other (comment)  modifications to fitness routine to minimize further pelvic floor dysfunction   PT Next Visit Plan reassess DRA and sensory    PT Home Exercise Plan progress pelvic floor exercises, dynamic stabilization series   Consulted and Agree with Plan of Care Patient          G-Codes - June 03, 2015 0858    Functional Assessment Tool Used IOQ-L 70.5%  Functional Limitation Self care   Self Care Current Status [phone]) At least 60 percent but less than 80 percent impaired, limited or restricted   Self Care Goal Status ([id number]) At least 40 percent but less than 60 percent impaired, limited or restricted       Problem List Patient Active Problem List   Diagnosis Date Noted  . Osteopenia 03/17/2015  . H/O adenomatous polyp of colon 02/04/2015  . Anxiety 01/11/2015  . 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 Arthritis of hand, degenerative 01/11/2015  . Anxiety state 08/04/2014  . Mixed incontinence 08/03/2014  . LBP (low back pain) 03/12/2014  . Hemochromatosis 02/10/2014  . ALLERGIC RHINITIS CAUSE UNSPECIFIED 11/29/2010  . UNSPECIFIED OSTEOPOROSIS 11/29/2010  . HEMOCHROMATOSIS, HX OF 11/29/2010    Jerl Mina  ,PT, DPT, E-RYT  05/30/2015, 9:05 AM  [place] MAIN [facility] SERVICES [address] [place], [place], [address] Phone: [phone]   Fax:  [phone]

## 2015-05-30 NOTE — Addendum Note (Signed)
Addended by: Jerl Mina on: 05/30/2015 09:06 AM   Modules accepted: Orders

## 2015-06-03 ENCOUNTER — Ambulatory Visit: Payer: Medicare Other | Attending: Urology | Admitting: Physical Therapy

## 2015-06-03 DIAGNOSIS — R278 Other lack of coordination: Secondary | ICD-10-CM | POA: Diagnosis not present

## 2015-06-03 DIAGNOSIS — N8189 Other female genital prolapse: Secondary | ICD-10-CM | POA: Insufficient documentation

## 2015-06-04 NOTE — Patient Instructions (Signed)
Work up to 5 reps of 10 sec holds Practice placing feet hip width apart w/ stairs, use deep core and  [place] (plantarflexion) to rise to next step

## 2015-06-04 NOTE — Therapy (Signed)
[place] MAIN [facility] SERVICES [address] [place], [place], [address] Phone: [phone]   Fax:  [phone]  Physical Therapy Treatment  Patient Details  Name: Judith Kim MRN: [id number] Date of Birth: 1936-07-04 Referring Provider:  Ardis Hughs, MD  Encounter Date: 06/03/2015      PT End of Session - 06/03/15 0804    Visit Number 6   Number of Visits 12   PT Start Time 0800   PT Stop Time 0845   PT Time Calculation (min) 45 min   Activity Tolerance Patient tolerated treatment well   Behavior During Therapy [facility] for tasks assessed/performed      Past Medical History  Diagnosis Date  . Stress incontinence   . Colon polyp   . Anemia   . Constipation   . Osteoarthritis of both hands   . Osteopenia   . Overactive bladder   . Hemochromatosis   . GERD (gastroesophageal reflux disease)   . H/O hematuria   . Osteoporosis   . Cataract cortical, senile   . Depression   . Chronic kidney disease   . Anxiety   . Incontinence in female   . Vaginal atrophy   . Hyperlipidemia     since 10/2013,  Dr. Candiss Norse has cleared her from meds and reported to her her levels are [facility].     Past Surgical History  Procedure Laterality Date  . Esophagogastroduodenoscopy    . Tonsillectomy    . Cataract extraction    . Colonoscopy    . Flexible sigmoidoscopy    . Colonoscopy with propofol N/A 03/28/2015    Procedure: COLONOSCOPY WITH PROPOFOL;  Surgeon: Manya Silvas, MD;  Location: [facility] ENDOSCOPY;  Service: Endoscopy;  Laterality: N/A;    There were no vitals filed for this visit.  Visit Diagnosis:  Coordination abnormal  Pelvic floor weakness      Subjective Assessment - 06/03/15 0805    Subjective Pt went on a group hiking trip and changed her urinary pad    Pertinent History Hx of 2 vaginal deliveries, denied pain. Hx of pelvic  Fx occured on sacrum where pt notices pressure sensation.    Patient Stated Goals decrease  leakage with hiking             [facility] PT Assessment - 06/04/15 0739    Sensation   Light Touch Appears Intact  intact compared to impaired to June '16 eval                  Pelvic Floor Special Questions - 06/04/15 0741    Diastasis Recti no DRA compared to 03/03/15   Pelvic Floor Internal Exam pt consented and no contraindications noted   Exam Type Vaginal   Palpation demo'd no accessory mm activation   Strength strong squeeze, against strong resistance   Strength # of reps 5  bladder more caudal/ventralwithout hips propped   Strength # of seconds 5   Biofeedback cues for paced breathing during rest between contractions           [facility] Adult PT Treatment/Exercise - 06/04/15 6389    Ambulation/Gait   Door Management --  single UE support on rail, narrow BOS   Gait Comments cues for increased metatarsal weightbearing for rise to next step,  deep core engaged on rise    Posture/Postural Control   Posture Comments mild difficulty w/ prone to plank, no sway back noted   Neuro Re-ed  Neuro Re-ed Details  paced breathing during rest between pelvic floor contractions  stairs:BOS under hips, deep core engaged, PF                PT Education - 06/04/15 0749    Education provided Yes   Education Details HEP   Person(s) Educated Patient   Methods Explanation;Demonstration;Tactile cues;Verbal cues   Comprehension Verbalized understanding;Returned demonstration             PT Long Term Goals - 05/30/15 0855    PT LONG TERM GOAL #1   Title Pt will increase her IOQ-L score from 70.5% to > 85% in order to demo impoved QOL.    Time 12   Period Weeks   Status New   PT LONG TERM GOAL #2   Title Pt will demo Grade 01/08/09/10 without pillow propped under hips in order to progress to upright pelvic floor exercises. .    Period Weeks   Status New   PT LONG TERM GOAL #3   Title Pt will report no leakage with trampoline jumping for 30 sec x 3 reps in order  to improve SUI and be able to hike without leakage.    PT LONG TERM GOAL #4   Title Pt will be compliant with HEP    Time 12   Period Weeks   Status New               Plan - 06/04/15 0750    Clinical Impression Statement Pt demo improved abdominopelvic fascial tensigrity w/ more caudal/ventrally position of bladder without hips being elevated on pillow along with an increased grade strength. Addressed stair navigation to apply principles of activating mm of the deep core system and  lower kinetic chain. Pt required neuromuscular re-eedu for both pelvic floor exercises and stair navigation. Pt will  be returning to PT from vacation in 2 weeks. Continue with functional applications of deep core activation and progression of pelvic floor strengthening.     Pt will benefit from skilled therapeutic intervention in order to improve on the following deficits Decreased mobility;Postural dysfunction;Decreased strength;Improper body mechanics;Decreased safety awareness;Decreased endurance;Decreased coordination;Decreased range of motion;Decreased balance;Decreased activity tolerance;Impaired sensation;Increased fascial restrictions   Rehab Potential Good   Clinical Impairments Affecting Rehab Potential hx of vaginal deliveries, Fx of pelvis, immobilized for 3 months   PT Frequency 1x / week   PT Duration 12 weeks   PT Treatment/Interventions ADLs/Self Care Home Management;Aquatic Therapy;Biofeedback;Electrical Stimulation;Cryotherapy;Gait training;Neuromuscular re-education;Manual techniques;Energy conservation;Functional mobility training;Moist Heat;Therapeutic activities;Therapeutic exercise;Balance training;Other (comment)  modifications to fitness routine to minimize further pelvic floor dysfunction   PT Next Visit Plan modify her exercises    PT Home Exercise Plan progress pelvic floor exercises, dynamic stabilization series   Consulted and Agree with Plan of Care Patient        Problem  List Patient Active Problem List   Diagnosis Date Noted  . Osteopenia 03/17/2015  . H/O adenomatous polyp of colon 02/04/2015  . Anxiety 01/11/2015  . 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 Arthritis of hand, degenerative 01/11/2015  . Anxiety state 08/04/2014  . Mixed incontinence 08/03/2014  . LBP (low back pain) 03/12/2014  . Hemochromatosis 02/10/2014  . ALLERGIC RHINITIS CAUSE UNSPECIFIED 11/29/2010  . UNSPECIFIED OSTEOPOROSIS 11/29/2010  . HEMOCHROMATOSIS, HX OF 11/29/2010    Jerl Mina ,PT, DPT, E-RYT   06/04/2015, 7:55 AM  [place] MAIN [facility] SERVICES [address] [place], [place], [address] Phone: [phone]   Fax:  [phone]      

## 2015-06-07 ENCOUNTER — Encounter: Payer: Self-pay | Admitting: Oncology

## 2015-06-15 ENCOUNTER — Ambulatory Visit: Payer: Medicare Other | Admitting: Oncology

## 2015-06-15 ENCOUNTER — Other Ambulatory Visit: Payer: Medicare Other

## 2015-06-24 ENCOUNTER — Ambulatory Visit: Payer: Medicare Other | Admitting: Physical Therapy

## 2015-06-24 DIAGNOSIS — R278 Other lack of coordination: Secondary | ICD-10-CM | POA: Diagnosis not present

## 2015-06-24 DIAGNOSIS — N8189 Other female genital prolapse: Secondary | ICD-10-CM

## 2015-06-24 NOTE — Therapy (Signed)
[place] MAIN [facility] SERVICES [address] [place], [place], [address] Phone: [phone]   Fax:  [phone]  Physical Therapy Treatment  Patient Details  Name: Judith Kim MRN: [id number] Date of Birth: 1936-08-31 Referring Provider:  Ardis Hughs, MD  Encounter Date: 06/24/2015      PT End of Session - 06/24/15 1012    Visit Number 7   Number of Visits 12   PT Start Time 0800   PT Stop Time 0920   PT Time Calculation (min) 80 min      Past Medical History  Diagnosis Date  . Stress incontinence   . Colon polyp   . Anemia   . Constipation   . Osteoarthritis of both hands   . Osteopenia   . Overactive bladder   . Hemochromatosis   . GERD (gastroesophageal reflux disease)   . H/O hematuria   . Osteoporosis   . Cataract cortical, senile   . Depression   . Chronic kidney disease   . Anxiety   . Incontinence in female   . Vaginal atrophy   . Hyperlipidemia     since 10/2013,  Dr. Candiss Norse has cleared her from meds and reported to her her levels are [facility].     Past Surgical History  Procedure Laterality Date  . Esophagogastroduodenoscopy    . Tonsillectomy    . Cataract extraction    . Colonoscopy    . Flexible sigmoidoscopy    . Colonoscopy with propofol N/A 03/28/2015    Procedure: COLONOSCOPY WITH PROPOFOL;  Surgeon: Manya Silvas, MD;  Location: [facility] ENDOSCOPY;  Service: Endoscopy;  Laterality: N/A;    There were no vitals filed for this visit.  Visit Diagnosis:  Coordination abnormal  Pelvic floor weakness      Subjective Assessment - 06/24/15 0809    Subjective Pt reported while being on her [place] trip 5 day , she felt one incident when she felt moist in her undergarments. Pt had a distressing episode where she wtetted her pants completely after drinking coffee , walking in the park. She had been feeling under the weather and had taken OTC meds . Pt thinks it has to do with drinking caffeine which pt   had not done for months and taking meds. Pt bought something urinary pads and padded clothing. Pt felt embarassed.     Pertinent History Hx of 2 vaginal deliveries, denied pain. Hx of pelvic  Fx occured on sacrum where pt notices pressure sensation.    Patient Stated Goals decrease leakage with hiking             [facility] PT Assessment - 06/24/15 0946    Coordination   Coordination and Movement Description lumbopelvic instability w/swimmers  (R UE/LLE)    Squat   Comments knees more anterior than feet    Strength   Overall Strength Comments lumbopelvic instability w/ hip ext in prone, hip ext 4/5 bilaterally   Palpation   SI assessment  pressure reported w/ moderate pressure over scarum, no abnormal fascial findings                  Pelvic Floor Special Questions - 06/24/15 1002    Diastasis Recti 1 finger   Pelvic Floor Internal Exam pt consented and no contraindications noted   Exam Type Vaginal   Strength good squeeze, good lift, able to hold agaisnt strong resistance   Strength # of reps 7   Strength # of  seconds 10   Biofeedback noted more dorsal position of bladder pre-Tx and posterior activation, bladder more cephaled post-Tx with more circumferential            [facility] Adult PT Treatment/Exercise - 06/24/15 1004    Self-Care   Self-Care --  reminded pt to perform belly massage   Neuro Re-ed    Neuro Re-ed Details  modifications to self-selected routine, replacing with posterior sling strengthening swimmers and bird dog and squats with pelvic floor activation    Manual Therapy   Myofascial Release fascial releases suprapubic area                 PT Education - 06/24/15 1006    Education provided Yes   Education Details HEP   Person(s) Educated Patient   Methods Explanation;Demonstration;Tactile cues;Verbal cues;Handout   Comprehension Verbalized understanding;Returned demonstration             PT Long Term Goals - 06/24/15 1007    PT  LONG TERM GOAL #1   Title Pt will increase her IOQ-L score from 70.5% to > 85% in order to demo impoved QOL.    Time 12   Period Weeks   Status On-going   PT LONG TERM GOAL #2   Title Pt will demo Grade 01/08/09/10 without pillow propped under hips in order to progress to upright pelvic floor exercises. .    Period Weeks   Status Partially Met   PT LONG TERM GOAL #3   Title Pt will report no leakage with trampoline jumping for 30 sec x 3 reps in order to improve SUI and be able to hike without leakage.    Status On-going   PT LONG TERM GOAL #4   Title Pt will be compliant with HEP    Time 12   Period Weeks   Status Achieved   PT LONG TERM GOAL #5   Title Pt will demo no lumbopelvic instability with swimmers RUE/LLE for 3 reps in order to demo improved postural control to progress to upright dynamic postural exercises that mimic hiking on uneven ground   Time 12   Period Weeks   Status New               Plan - 06/24/15 1013    Clinical Impression Statement Pt is progressing well towards her goals. Pt demo'd improved diastasis recti as pt avoided sit-ups in her workout routine. Pt demo'd more cephaled positioning of her bladder following manual Tx and thus elicited a more circumferential contraction without a need for pillows to be propped under hips. Pt 's deep core strengthening exercises were progressed to target posterior chain of muscles given her Hx of pelvic fractures posteriorly. Pt still would benefit from skilled PT as she demo'd poor control of lumbopelvic stability. PT prefers to come to PT 1x month in order to keep working on HEP over several weeks.    Pt will benefit from skilled therapeutic intervention in order to improve on the following deficits Decreased mobility;Postural dysfunction;Decreased strength;Improper body mechanics;Decreased safety awareness;Decreased endurance;Decreased coordination;Decreased range of motion;Decreased balance;Decreased activity  tolerance;Impaired sensation;Increased fascial restricitons   Rehab Potential Good   Clinical Impairments Affecting Rehab Potential hx of vaginal deliveries, Fx of pelvis, immobilized for 3 months   PT Frequency Monthy   PT Duration 12 weeks   PT Treatment/Interventions ADLs/Self Care Home Management;Aquatic Therapy;Biofeedback;Electrical Stimulation;Cryotherapy;Gait training;Neuromuscular re-education;Manual techniques;Energy conservation;Functional mobility training;Moist Heat;Therapeutic activities;Therapeutic exercise;Balance training;Other (comment)  modifications to fitness routine to minimize further pelvic  floor dysfunction   PT Next Visit Plan modify her exercises    PT Home Exercise Plan progress pelvic floor exercises in upright positions, dynamic stabilization series 3-4   Consulted and Agree with Plan of Care Patient        Problem List Patient Active Problem List   Diagnosis Date Noted  . Osteopenia 03/17/2015  . H/O adenomatous polyp of colon 02/04/2015  . Anxiety 01/11/2015  . 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 Arthritis of hand, degenerative 01/11/2015  . Anxiety state 08/04/2014  . Mixed incontinence 08/03/2014  . LBP (low back pain) 03/12/2014  . Hemochromatosis 02/10/2014  . ALLERGIC RHINITIS CAUSE UNSPECIFIED 11/29/2010  . UNSPECIFIED OSTEOPOROSIS 11/29/2010  . HEMOCHROMATOSIS, HX OF 11/29/2010    Jerl Mina  ,PT, DPT, E-RYT  06/24/2015, 10:17 AM  [place] MAIN [facility] SERVICES [address] [place], [place], [address] Phone: [phone]   Fax:  [phone]

## 2015-06-24 NOTE — Patient Instructions (Addendum)
Discontinue Dynamic Stabilization 1-2  Replace with   5 reps/ 2-3 sets each  (1st two weeks) , 10 reps/ 2 sets (last 2 weeks of the month)  Coordinate moving arms and legs with exhale (belly hug)   Swimmers (opp arm and leg), ribcage/ ASIS does not move and still touches the floor) micro lifts of leg to avoid bowing of back    Bird dog (opp arm and leg),  Imagine plate does not move off of your back     _________________________________  Add squat (knees behind toes) exhale up  _________________________________  Elinor Parkinson diagonals  Pulling the seatbelt down    Starting the mover    ______    Pictures drawn into pt's personal book

## 2015-06-29 ENCOUNTER — Ambulatory Visit: Payer: Self-pay | Admitting: Oncology

## 2015-06-29 ENCOUNTER — Other Ambulatory Visit: Payer: Medicare Other

## 2015-07-01 ENCOUNTER — Other Ambulatory Visit: Payer: Self-pay | Admitting: *Deleted

## 2015-07-01 DIAGNOSIS — Z862 Personal history of diseases of the blood and blood-forming organs and certain disorders involving the immune mechanism: Secondary | ICD-10-CM

## 2015-07-04 ENCOUNTER — Inpatient Hospital Stay: Payer: Medicare Other

## 2015-07-04 ENCOUNTER — Inpatient Hospital Stay (HOSPITAL_BASED_OUTPATIENT_CLINIC_OR_DEPARTMENT_OTHER): Payer: Medicare Other | Admitting: Oncology

## 2015-07-04 ENCOUNTER — Inpatient Hospital Stay: Payer: Medicare Other | Attending: Oncology

## 2015-07-04 VITALS — BP 143/71 | HR 66 | Temp 98.0°F | Resp 20 | Wt 124.7 lb

## 2015-07-04 DIAGNOSIS — M810 Age-related osteoporosis without current pathological fracture: Secondary | ICD-10-CM | POA: Insufficient documentation

## 2015-07-04 DIAGNOSIS — F418 Other specified anxiety disorders: Secondary | ICD-10-CM | POA: Diagnosis not present

## 2015-07-04 DIAGNOSIS — M19041 Primary osteoarthritis, right hand: Secondary | ICD-10-CM | POA: Insufficient documentation

## 2015-07-04 DIAGNOSIS — K219 Gastro-esophageal reflux disease without esophagitis: Secondary | ICD-10-CM | POA: Diagnosis not present

## 2015-07-04 DIAGNOSIS — Z23 Encounter for immunization: Secondary | ICD-10-CM

## 2015-07-04 DIAGNOSIS — Z79899 Other long term (current) drug therapy: Secondary | ICD-10-CM | POA: Diagnosis not present

## 2015-07-04 DIAGNOSIS — M19042 Primary osteoarthritis, left hand: Secondary | ICD-10-CM | POA: Insufficient documentation

## 2015-07-04 DIAGNOSIS — E785 Hyperlipidemia, unspecified: Secondary | ICD-10-CM

## 2015-07-04 DIAGNOSIS — Z862 Personal history of diseases of the blood and blood-forming organs and certain disorders involving the immune mechanism: Secondary | ICD-10-CM

## 2015-07-04 DIAGNOSIS — N189 Chronic kidney disease, unspecified: Secondary | ICD-10-CM | POA: Diagnosis not present

## 2015-07-04 DIAGNOSIS — Z86010 Personal history of colonic polyps: Secondary | ICD-10-CM | POA: Diagnosis not present

## 2015-07-04 LAB — CBC WITH DIFFERENTIAL/PLATELET
BASOS ABS: 0.1 10*3/uL (ref 0–0.1)
BASOS PCT: 1 %
EOS ABS: 0.1 10*3/uL (ref 0–0.7)
Eosinophils Relative: 2 %
HEMATOCRIT: 38.3 % (ref 35.0–47.0)
HEMOGLOBIN: 13 g/dL (ref 12.0–16.0)
Lymphocytes Relative: 24 %
Lymphs Abs: 1.6 10*3/uL (ref 1.0–3.6)
MCH: 32.3 pg (ref 26.0–34.0)
MCHC: 33.9 g/dL (ref 32.0–36.0)
MCV: 95.4 fL (ref 80.0–100.0)
Monocytes Absolute: 0.5 10*3/uL (ref 0.2–0.9)
Monocytes Relative: 8 %
Neutro Abs: 4.6 10*3/uL (ref 1.4–6.5)
Neutrophils Relative %: 65 %
Platelets: 311 10*3/uL (ref 150–440)
RBC: 4.01 MIL/uL (ref 3.80–5.20)
RDW: 12.7 % (ref 11.5–14.5)
WBC: 6.9 10*3/uL (ref 3.6–11.0)

## 2015-07-04 LAB — FERRITIN: Ferritin: 98 ng/mL (ref 11–307)

## 2015-07-04 MED ORDER — INFLUENZA VAC SPLIT QUAD 0.5 ML IM SUSY: 0.5000 mL | PREFILLED_SYRINGE | Freq: Once | INTRAMUSCULAR | Status: DC

## 2015-07-19 ENCOUNTER — Other Ambulatory Visit: Payer: Self-pay | Admitting: Family Medicine

## 2015-07-19 NOTE — Progress Notes (Signed)
[place]  Telephone:([phone] [phone]  ID: Judith Kim OB: [birth year]  MR#: [id number]  OFB#:[id number]  Patient Care Team: Glendon Axe, MD as PCP - General (Internal Medicine)  CHIEF COMPLAINT: Hemachromatosis. No chief complaint on file.   INTERVAL HISTORY: Patient returns to clinic today for repeat laboratory work, further evaluation, and consideration of additional phlebotomy. She continues to feel well and is asymptomatic.  She has no neurologic complaints.  She has had no recent fevers or illnesses.  She has a good appetite and denies weight loss.  She has no chest pain or shortness of breath.  She denies any nausea, vomiting, constipation, or diarrhea.  She has no urinary complaints.  Patient offers no specific complaints today.   REVIEW OF SYSTEMS:   Review of Systems  Constitutional: Negative.  Negative for malaise/fatigue.  Musculoskeletal: Negative.   Neurological: Negative.  Negative for weakness.    As per HPI. Otherwise, a complete review of systems is negatve.  PAST MEDICAL HISTORY: Past Medical History  Diagnosis Date  . Stress incontinence   . Colon polyp   . Anemia   . Constipation   . Osteoarthritis of both hands   . Osteopenia   . Overactive bladder   . Hemochromatosis   . GERD (gastroesophageal reflux disease)   . H/O hematuria   . Osteoporosis   . Cataract cortical, senile   . Depression   . Chronic kidney disease   . Anxiety   . Incontinence in female   . Vaginal atrophy   . Hyperlipidemia     since 10/2013,  Dr. Candiss Norse has cleared her from meds and reported to her her levels are [facility].     PAST SURGICAL HISTORY: Past Surgical History  Procedure Laterality Date  . Esophagogastroduodenoscopy    . Tonsillectomy    . Cataract extraction    . Colonoscopy    . Flexible sigmoidoscopy    . Colonoscopy with propofol N/A 03/28/2015    Procedure: COLONOSCOPY WITH PROPOFOL;  Surgeon: Manya Silvas, MD;   Location: [facility] ENDOSCOPY;  Service: Endoscopy;  Laterality: N/A;    FAMILY HISTORY Family History  Problem Relation Age of Onset  . Bipolar disorder Father   . Cancer Mother   . Kidney disease Mother   . Bladder Cancer Mother        ADVANCED DIRECTIVES:    HEALTH MAINTENANCE: Social History  Substance Use Topics  . Smoking status: Never Smoker   . Smokeless tobacco: Not on file  . Alcohol Use: 21.0 oz/week    35 Glasses of wine per week     Colonoscopy:  PAP:  Bone density:  Lipid panel:  Allergies  Allergen Reactions  . Nitrofurantoin     Current Outpatient Prescriptions  Medication Sig Dispense Refill  . calcium carbonate (OS-CAL - DOSED IN MG OF ELEMENTAL CALCIUM) 1250 (500 CA) MG tablet Take by mouth.    . chlorpheniramine (CHLOR-TRIMETON) 4 MG tablet Take 4 mg by mouth as needed for allergies.    . Cholecalciferol (VITAMIN D3) 5000 UNITS CAPS Take 1 capsule by mouth daily.    . ciclopirox (PENLAC) 8 % solution Apply topically at bedtime. Apply over nail and surrounding skin. Apply daily over previous coat. After seven (7) days, may remove with alcohol and continue cycle.    . Cyanocobalamin (VITAMIN B-12) 1000 MCG SUBL Take by mouth.    . diphenhydrAMINE (BENADRYL) 50 MG capsule Take 50 mg by mouth every 6 (six) hours as  needed.    . docusate sodium (COLACE) 100 MG capsule Take 100 mg by mouth 2 (two) times daily.    Marland Kitchen estradiol (ESTRACE) 0.1 MG/GM vaginal cream Place 1 Applicatorful vaginally at bedtime.    . Flaxseed, Linseed, (FLAXSEED OIL) 1000 MG CAPS Take by mouth.    . Loratadine 10 MG CAPS Take 10 mg by mouth as needed.    . Multiple Vitamin (MULTI-VITAMINS) TABS Take by mouth.    . Omega 3 1200 MG CAPS Take 1 capsule by mouth daily.    . polycarbophil (FIBERCON) 625 MG tablet Take 625 mg by mouth daily.    . polyethylene glycol powder (GLYCOLAX/MIRALAX) powder     . Probiotic Product (PROBIOTIC & ACIDOPHILUS EX ST PO) Take by mouth.     Current  Facility-Administered Medications  Medication Dose Route Frequency Provider Last Rate Last Dose  . Influenza vac split quadrivalent PF (FLUARIX) injection 0.5 mL  0.5 mL Intramuscular Once Lloyd Huger, MD        OBJECTIVE: There were no vitals filed for this visit.   There is no weight on file to calculate BMI.    ECOG FS:0 - Asymptomatic  General: Well-developed, well-nourished, no acute distress. Eyes: anicteric sclera. Lungs: Clear to auscultation bilaterally. Heart: Regular rate and rhythm. No rubs, murmurs, or gallops. Abdomen: Soft, nontender, nondistended. No organomegaly noted, normoactive bowel sounds. Musculoskeletal: No edema, cyanosis, or clubbing. Neuro: Alert, answering all questions appropriately. Cranial nerves grossly intact. Skin: No rashes or petechiae noted. Psych: Normal affect.   LAB RESULTS:  Lab Results  Component Value Date   PROT 7.0 06/14/2014   ALBUMIN 4.0 06/14/2014   AST 17 06/14/2014   ALT 20 06/14/2014   ALKPHOS 83 06/14/2014   BILITOT 0.4 06/14/2014    Lab Results  Component Value Date   WBC 6.9 07/04/2015   NEUTROABS 4.6 07/04/2015   HGB 13.0 07/04/2015   HCT 38.3 07/04/2015   MCV 95.4 07/04/2015   PLT 311 07/04/2015     STUDIES: No results found.  ASSESSMENT: Hemochromatosis.  PLAN:    1. Hemachromatatosis: Patient's ferritin is 99 today. Her goal is approximately 50, therefore she will receive 200 mL phlebotomy again today. She continues to only needs phlebotomy every 3 months.  Patient will return to clinic in 3 months for repeat laboratory work and consideration of phlebotomy.  2. Hemoglobin: Within normal limits. Continue to monitor while undergoing phlebotomy.  Patient expressed understanding and was in agreement with this plan. She also understands that She can call clinic at any time with any questions, concerns, or complaints.    Lloyd Huger, MD   07/19/2015 9:22 AM

## 2015-07-29 ENCOUNTER — Ambulatory Visit: Payer: Medicare Other | Attending: Urology | Admitting: Physical Therapy

## 2015-07-29 DIAGNOSIS — R278 Other lack of coordination: Secondary | ICD-10-CM | POA: Diagnosis present

## 2015-07-29 DIAGNOSIS — N8189 Other female genital prolapse: Secondary | ICD-10-CM | POA: Diagnosis present

## 2015-07-29 NOTE — Therapy (Signed)
[place] MAIN [facility] SERVICES [address] [place], [place], [address] Phone: [phone]   Fax:  [phone]  Physical Therapy Treatment /Discharge Summary   Patient Details  Name: Judith Kim MRN: [id number] Date of Birth: 1935/11/15 No Data Recorded  Encounter Date: 07/29/2015      PT End of Session - 07/29/15 2235    Visit Number 8   Number of Visits 12   PT Start Time 0805   PT Stop Time 0915   PT Time Calculation (min) 70 min      Past Medical History  Diagnosis Date  . Stress incontinence   . Colon polyp   . Anemia   . Constipation   . Osteoarthritis of both hands   . Osteopenia   . Overactive bladder   . Hemochromatosis   . GERD (gastroesophageal reflux disease)   . H/O hematuria   . Osteoporosis   . Cataract cortical, senile   . Depression   . Chronic kidney disease   . Anxiety   . Incontinence in female   . Vaginal atrophy   . Hyperlipidemia     since 10/2013,  Dr. Candiss Norse has cleared her from meds and reported to her her levels are [facility].     Past Surgical History  Procedure Laterality Date  . Esophagogastroduodenoscopy    . Tonsillectomy    . Cataract extraction    . Colonoscopy    . Flexible sigmoidoscopy    . Colonoscopy with propofol N/A 03/28/2015    Procedure: COLONOSCOPY WITH PROPOFOL;  Surgeon: Manya Silvas, MD;  Location: [facility] ENDOSCOPY;  Service: Endoscopy;  Laterality: N/A;    There were no vitals filed for this visit.  Visit Diagnosis:  Coordination abnormal  Pelvic floor weakness      Subjective Assessment - 07/29/15 0811    Subjective Pt is "very pleased with her urological changes" and that her remaining issues are "livable". Pt has been able to delay urination from every 60 min to 57mn to now every 3 hrs despite drinking the same amount of water. Pt knows her coffee intake is a trigger for frequency.  When hiking for 1.5 hrs, pt does experiences "little dampness" in her urinary  pads. Pt reported the exercises: swimmers and bird dog bothered her back and thus, she has decreased her frequency in performing her exercises to 1x week.    Pertinent History Hx of 2 vaginal deliveries, denied pain. Hx of pelvic  Fx occurred on sacrum where pt notices pressure sensation.    Patient Stated Goals decrease leakage with hiking             [facility] Assessment - 07/29/15 0924    Observation/Other Assessments   Observations required cuing for correct position with review on PNF cross diagonal exercises   Other Surveys  --  IOQ-L: 75%    Sit to Stand   Comments breathholding    Posture/Postural Control   Posture Comments seated posture with knee together, able to correct                     [facility]Exercise - 07/29/15 0924    Self-Care   Self-Care --  d/c, community classes, principle of exercise, discontinue to swimmers/ birddog,   Therapeutic Activites    Other Therapeutic Activities walking, stairclimbing, lifting (3 reps) with deep core engaged   Neuro Re-ed    Neuro Re-ed Details  deep core acitvation, principles of  challengging deep core system in functional activities: cues for walking downhill, step-response to manual perturbations with gait belt 10 ft x 3                 PT Education - Aug 26, 2015 December 07, 2232    Education provided Yes   Education Details HEP   Person(s) Educated Patient   Methods Explanation;Demonstration;Tactile cues;Verbal cues;Handout   Comprehension Verbalized understanding;Returned demonstration             PT Long Term Goals - 08-26-2015 4315    PT LONG TERM GOAL #1   Title Pt will increase her IOQ-L score from 70.5% to > 85% in order to demo impoved QOL.  (08-26-15: 75% )    Time 12   Period Weeks   Status Partially Met   PT LONG TERM GOAL #2   Title Pt will demo Grade 01/08/09/10 without pillow propped under hips in order to progress to upright pelvic floor exercises. .    Period Weeks   Status  Achieved   PT LONG TERM GOAL #3   Title Pt will report no leakage with trampoline jumping for 30 sec x 3 reps in order to improve SUI and be able to hike without leakage.    Status Deferred   PT LONG TERM GOAL #4   Title Pt will be compliant with HEP    Time 12   Period Weeks   Status Achieved   PT LONG TERM GOAL #5   Title Pt will demo no lumbopelvic instability with swimmers RUE/LLE for 3 reps in order to demo improved postural control to progress to upright dynamic postural exercises that mimic hiking on uneven ground   Time 12   Period Weeks   Status Deferred               Plan - August 26, 2015 December 07, 2234    Clinical Impression Statement Pt achieved 2/3 goals with improved urinary function. Pt was close to meeting her last goal on her IOQ-L with an increased score from 70.5% to 75%.   Pt has modified her bladder irritant intake and has shown significantly improvement with increased pelvic floor coordination/strength/ endurance, decreased abdominal separation, improved motor control patterns that decreases downward pressure on the pelvic floor. Pt is ready for d/c and is motivated to continue with her HEP and to join community fitness classes. Pt feels a Education officer, museum Better (6) " on the Global Rate of Change. Thank you for your referral.     Pt will benefit from skilled therapeutic intervention in order to improve on the following deficits Decreased mobility;Postural dysfunction;Decreased strength;Improper body mechanics;Decreased safety awareness;Decreased endurance;Decreased coordination;Decreased range of motion;Decreased balance;Decreased activity tolerance;Impaired sensation;Increased fascial restrictions   Rehab Potential Good   Clinical Impairments Affecting Rehab Potential hx of vaginal deliveries, Fx of pelvis, immobilized for 3 months   PT Frequency Monthy   PT Duration 12 weeks   PT Treatment/Interventions ADLs/Self Care Home Management;Aquatic Therapy;Biofeedback;Electrical  Stimulation;Cryotherapy;Gait training;Neuromuscular re-education;Manual techniques;Energy conservation;Functional mobility training;Moist Heat;Therapeutic activities;Therapeutic exercise;Balance training;Other (comment)  modifications to fitness routine to minimize further pelvic floor dysfunction   PT Next Visit Plan modify her exercises    PT Home Exercise Plan progress pelvic floor exercises in upright positions, dynamic stabilization series 3-4   Consulted and Agree with Plan of Care Patient          G-Codes - 2015/08/26 12/07/2242    Functional Assessment Tool Used IOQ-L 75%    Functional Limitation Self care   Self Care Current  Status [phone]) At least 60 percent but less than 80 percent impaired, limited or restricted   Self Care Goal Status ([id number]) At least 40 percent but less than 60 percent impaired, limited or restricted   Self Care Discharge Status [phone]) At least 20 percent but less than 40 percent impaired, limited or restricted      Problem List Patient Active Problem List   Diagnosis Date Noted  . Osteopenia 03/17/2015  . H/O adenomatous polyp of colon 02/04/2015  . Anxiety 01/11/2015  . 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 Arthritis of hand, degenerative 01/11/2015  . Anxiety state 08/04/2014  . Mixed incontinence 08/03/2014  . LBP (low back pain) 03/12/2014  . Hemochromatosis 02/10/2014  . ALLERGIC RHINITIS CAUSE UNSPECIFIED 11/29/2010  . UNSPECIFIED OSTEOPOROSIS 11/29/2010  . HEMOCHROMATOSIS, HX OF 11/29/2010    Jerl Mina ,PT, DPT, E-RYT  07/29/2015, 10:45 PM  [place] MAIN [facility] SERVICES [address] [place], [place], [address] Phone: [phone]   Fax:  [phone]  Name: Judith Kim MRN: [id number] Date of Birth: Apr 30, 1936

## 2015-07-29 NOTE — Patient Instructions (Addendum)
     Movement nutrition :  Strength: continue with your book or [email or web address] "handiband videos"   Aerobic: dancerise Higher education careers adviser)   Core-stability/ Balance   : Tai chi   Higher education careers adviser)   Flexibility: gentle yoga  ([place]: w/ Shin-Yiing at 5:30 Wednesdays)

## 2015-10-18 ENCOUNTER — Other Ambulatory Visit: Payer: Self-pay | Admitting: Internal Medicine

## 2015-10-18 DIAGNOSIS — Z1239 Encounter for other screening for malignant neoplasm of breast: Secondary | ICD-10-CM

## 2015-10-19 ENCOUNTER — Inpatient Hospital Stay (HOSPITAL_BASED_OUTPATIENT_CLINIC_OR_DEPARTMENT_OTHER): Payer: Medicare Other | Admitting: Oncology

## 2015-10-19 ENCOUNTER — Inpatient Hospital Stay: Payer: Medicare Other

## 2015-10-19 ENCOUNTER — Inpatient Hospital Stay: Payer: Medicare Other | Attending: Oncology

## 2015-10-19 VITALS — BP 144/78 | HR 72 | Temp 99.2°F | Resp 18 | Wt 127.6 lb

## 2015-10-19 DIAGNOSIS — Z862 Personal history of diseases of the blood and blood-forming organs and certain disorders involving the immune mechanism: Secondary | ICD-10-CM

## 2015-10-19 DIAGNOSIS — F418 Other specified anxiety disorders: Secondary | ICD-10-CM

## 2015-10-19 DIAGNOSIS — M1990 Unspecified osteoarthritis, unspecified site: Secondary | ICD-10-CM | POA: Insufficient documentation

## 2015-10-19 DIAGNOSIS — M810 Age-related osteoporosis without current pathological fracture: Secondary | ICD-10-CM | POA: Diagnosis not present

## 2015-10-19 DIAGNOSIS — E785 Hyperlipidemia, unspecified: Secondary | ICD-10-CM

## 2015-10-19 DIAGNOSIS — Z79899 Other long term (current) drug therapy: Secondary | ICD-10-CM

## 2015-10-19 DIAGNOSIS — N189 Chronic kidney disease, unspecified: Secondary | ICD-10-CM | POA: Insufficient documentation

## 2015-10-19 DIAGNOSIS — K219 Gastro-esophageal reflux disease without esophagitis: Secondary | ICD-10-CM | POA: Insufficient documentation

## 2015-10-19 DIAGNOSIS — Z23 Encounter for immunization: Secondary | ICD-10-CM

## 2015-10-19 LAB — CBC WITH DIFFERENTIAL/PLATELET
Basophils Absolute: 0.1 10*3/uL (ref 0–0.1)
Basophils Relative: 1 %
Eosinophils Absolute: 0.1 10*3/uL (ref 0–0.7)
Eosinophils Relative: 2 %
HEMATOCRIT: 39.5 % (ref 35.0–47.0)
Hemoglobin: 13.4 g/dL (ref 12.0–16.0)
LYMPHS PCT: 29 %
Lymphs Abs: 1.9 10*3/uL (ref 1.0–3.6)
MCH: 32 pg (ref 26.0–34.0)
MCHC: 34 g/dL (ref 32.0–36.0)
MCV: 94.1 fL (ref 80.0–100.0)
MONO ABS: 0.6 10*3/uL (ref 0.2–0.9)
Monocytes Relative: 9 %
NEUTROS ABS: 3.9 10*3/uL (ref 1.4–6.5)
Neutrophils Relative %: 59 %
Platelets: 347 10*3/uL (ref 150–440)
RBC: 4.2 MIL/uL (ref 3.80–5.20)
RDW: 12.8 % (ref 11.5–14.5)
WBC: 6.6 10*3/uL (ref 3.6–11.0)

## 2015-10-19 LAB — FERRITIN: Ferritin: 128 ng/mL (ref 11–307)

## 2015-10-19 NOTE — Progress Notes (Signed)
Patient reports having bone density scan 10/20/13

## 2015-10-20 ENCOUNTER — Encounter: Payer: Self-pay | Admitting: Oncology

## 2015-10-23 NOTE — Progress Notes (Signed)
[place]  Telephone:(336) [phone] [phone]) [phone]  ID: [name] OB: March 20, 1936  MR#: [id number]  [id number]  Patient Care Team: Glendon Axe, MD as PCP - General (Internal Medicine)  CHIEF COMPLAINT: Hemachromatosis. Chief Complaint  Patient presents with  . Follow-up    INTERVAL HISTORY: Patient returns to clinic today for repeat laboratory work, further evaluation, and consideration of additional phlebotomy. She continues to feel well and is asymptomatic.  She has no neurologic complaints.  She has had no recent fevers or illnesses.  She has a good appetite and denies weight loss.  She has no chest pain or shortness of breath.  She denies any nausea, vomiting, constipation, or diarrhea.  She has no urinary complaints.  Patient offers no specific complaints today.   REVIEW OF SYSTEMS:   Review of Systems  Constitutional: Negative.  Negative for malaise/fatigue.  Respiratory: Negative.  Negative for shortness of breath.   Cardiovascular: Negative.  Negative for chest pain.  Gastrointestinal: Negative.   Musculoskeletal: Negative.   Neurological: Negative.  Negative for weakness.    As per HPI. Otherwise, a complete review of systems is negatve.  PAST MEDICAL HISTORY: Past Medical History  Diagnosis Date  . Stress incontinence   . Colon polyp   . Anemia   . Constipation   . Osteoarthritis of both hands   . Osteopenia   . Overactive bladder   . Hemochromatosis   . GERD (gastroesophageal reflux disease)   . H/O hematuria   . Osteoporosis   . Cataract cortical, senile   . Depression   . Chronic kidney disease   . Anxiety   . Incontinence in female   . Vaginal atrophy   . Hyperlipidemia     since 10/2013,  Dr. Candiss Norse has cleared her from meds and reported to her her levels are [facility].     PAST SURGICAL HISTORY: Past Surgical History  Procedure Laterality Date  . Esophagogastroduodenoscopy    . Tonsillectomy    . Cataract  extraction    . Colonoscopy    . Flexible sigmoidoscopy    . Colonoscopy with propofol N/A 03/28/2015    Procedure: COLONOSCOPY WITH PROPOFOL;  Surgeon: Manya Silvas, MD;  Location: [facility] ENDOSCOPY;  Service: Endoscopy;  Laterality: N/A;    FAMILY HISTORY Family History  Problem Relation Age of Onset  . Bipolar disorder Father   . Cancer Mother   . Kidney disease Mother   . Bladder Cancer Mother        ADVANCED DIRECTIVES:    HEALTH MAINTENANCE: Social History  Substance Use Topics  . Smoking status: Never Smoker   . Smokeless tobacco: Not on file  . Alcohol Use: 21.0 oz/week    35 Glasses of wine per week     Colonoscopy:  PAP:  Bone density:  Lipid panel:  Allergies  Allergen Reactions  . Nitrofurantoin     Current Outpatient Prescriptions  Medication Sig Dispense Refill  . calcium carbonate (OS-CAL - DOSED IN MG OF ELEMENTAL CALCIUM) 1250 (500 CA) MG tablet Take by mouth.    . chlorpheniramine (CHLOR-TRIMETON) 4 MG tablet Take 4 mg by mouth as needed for allergies.    . Cholecalciferol (VITAMIN D3) 5000 UNITS CAPS Take 1 capsule by mouth daily.    . ciclopirox (PENLAC) 8 % solution Apply topically at bedtime. Apply over nail and surrounding skin. Apply daily over previous coat. After seven (7) days, may remove with alcohol and continue cycle.    . Cyanocobalamin (  VITAMIN B-12) 1000 MCG SUBL Take by mouth.    . diphenhydrAMINE (BENADRYL) 50 MG capsule Take 50 mg by mouth every 6 (six) hours as needed.    . docusate sodium (COLACE) 100 MG capsule Take 100 mg by mouth 2 (two) times daily.    . Flaxseed, Linseed, (FLAXSEED OIL) 1000 MG CAPS Take by mouth.    . fluticasone (FLONASE) 50 MCG/ACT nasal spray Place 2 sprays into the nose.    . Multiple Vitamin (MULTI-VITAMINS) TABS Take by mouth.    . Omega 3 1200 MG CAPS Take 1 capsule by mouth daily.    . polycarbophil (FIBERCON) 625 MG tablet Take 625 mg by mouth daily.    . polyethylene glycol powder  (GLYCOLAX/MIRALAX) powder     . Probiotic Product (PROBIOTIC & ACIDOPHILUS EX ST PO) Take by mouth.    . estradiol (ESTRACE) 0.1 MG/GM vaginal cream Place 1 Applicatorful vaginally at bedtime. Reported on 10/19/2015    . Loratadine 10 MG CAPS Take 10 mg by mouth as needed. Reported on 10/19/2015     No current facility-administered medications for this visit.    OBJECTIVE: Filed Vitals:   10/19/15 1422 10/19/15 1433  BP:  144/78  Pulse:  72  Temp: 99.2 F (37.3 C)   Resp: 18      Body mass index is 20.61 kg/(m^2).    ECOG FS:0 - Asymptomatic  General: Well-developed, well-nourished, no acute distress. Eyes: anicteric sclera. Lungs: Clear to auscultation bilaterally. Heart: Regular rate and rhythm. No rubs, murmurs, or gallops. Abdomen: Soft, nontender, nondistended. No organomegaly noted, normoactive bowel sounds. Musculoskeletal: No edema, cyanosis, or clubbing. Neuro: Alert, answering all questions appropriately. Cranial nerves grossly intact. Skin: No rashes or petechiae noted. Psych: Normal affect.   LAB RESULTS:  Lab Results  Component Value Date   PROT 7.0 06/14/2014   ALBUMIN 4.0 06/14/2014   AST 17 06/14/2014   ALT 20 06/14/2014   ALKPHOS 83 06/14/2014   BILITOT 0.4 06/14/2014    Lab Results  Component Value Date   WBC 6.6 10/19/2015   NEUTROABS 3.9 10/19/2015   HGB 13.4 10/19/2015   HCT 39.5 10/19/2015   MCV 94.1 10/19/2015   PLT 347 10/19/2015     STUDIES: No results found.  ASSESSMENT: Hemochromatosis.  PLAN:    1. Hemachromatatosis: Patient's ferritin is 128 today. Her goal is approximately 50, therefore she will receive 200 mL phlebotomy again today. She continues to only needs phlebotomy every 3 months.  Patient will return to clinic in 3 months for repeat laboratory work and consideration of phlebotomy and then in 6 months for further evaluation.  2. Hemoglobin: Within normal limits. Continue to monitor while undergoing phlebotomy.  Patient  expressed understanding and was in agreement with this plan. She also understands that She can call clinic at any time with any questions, concerns, or complaints.    Lloyd Huger, MD   10/23/2015 10:37 AM

## 2016-01-11 ENCOUNTER — Other Ambulatory Visit: Payer: Self-pay | Admitting: Internal Medicine

## 2016-01-11 ENCOUNTER — Ambulatory Visit
Admission: RE | Admit: 2016-01-11 | Discharge: 2016-01-11 | Disposition: A | Discharge status: Home / Self Care | Payer: Medicare Other | Source: Ambulatory Visit | Attending: Internal Medicine | Admitting: Internal Medicine

## 2016-01-11 DIAGNOSIS — Z1239 Encounter for other screening for malignant neoplasm of breast: Secondary | ICD-10-CM

## 2016-01-11 DIAGNOSIS — Z1231 Encounter for screening mammogram for malignant neoplasm of breast: Secondary | ICD-10-CM | POA: Diagnosis present

## 2016-01-17 ENCOUNTER — Other Ambulatory Visit: Payer: Medicare Other

## 2016-01-17 ENCOUNTER — Other Ambulatory Visit: Payer: Self-pay | Admitting: *Deleted

## 2016-01-18 ENCOUNTER — Inpatient Hospital Stay: Payer: Medicare Other

## 2016-01-18 ENCOUNTER — Inpatient Hospital Stay: Payer: Medicare Other | Attending: Oncology

## 2016-01-18 VITALS — BP 130/78 | HR 53 | Temp 96.5°F

## 2016-01-18 DIAGNOSIS — Z862 Personal history of diseases of the blood and blood-forming organs and certain disorders involving the immune mechanism: Secondary | ICD-10-CM

## 2016-01-18 LAB — CBC WITH DIFFERENTIAL/PLATELET
Basophils Absolute: 0 10*3/uL (ref 0–0.1)
Basophils Relative: 0 %
EOS ABS: 0.1 10*3/uL (ref 0–0.7)
EOS PCT: 1 %
HCT: 36.4 % (ref 35.0–47.0)
Hemoglobin: 12.6 g/dL (ref 12.0–16.0)
LYMPHS ABS: 1.3 10*3/uL (ref 1.0–3.6)
Lymphocytes Relative: 22 %
MCH: 33.2 pg (ref 26.0–34.0)
MCHC: 34.6 g/dL (ref 32.0–36.0)
MCV: 96 fL (ref 80.0–100.0)
MONO ABS: 0.6 10*3/uL (ref 0.2–0.9)
Monocytes Relative: 10 %
Neutro Abs: 3.8 10*3/uL (ref 1.4–6.5)
Neutrophils Relative %: 67 %
PLATELETS: 316 10*3/uL (ref 150–440)
RBC: 3.8 MIL/uL (ref 3.80–5.20)
RDW: 12.6 % (ref 11.5–14.5)
WBC: 5.8 10*3/uL (ref 3.6–11.0)

## 2016-01-18 LAB — FERRITIN: FERRITIN: 81 ng/mL (ref 11–307)

## 2016-01-24 DIAGNOSIS — M79673 Pain in unspecified foot: Secondary | ICD-10-CM | POA: Insufficient documentation

## 2016-04-17 ENCOUNTER — Inpatient Hospital Stay: Payer: Medicare Other | Attending: Oncology | Admitting: *Deleted

## 2016-04-17 ENCOUNTER — Inpatient Hospital Stay (HOSPITAL_BASED_OUTPATIENT_CLINIC_OR_DEPARTMENT_OTHER): Payer: Medicare Other | Admitting: Oncology

## 2016-04-17 ENCOUNTER — Inpatient Hospital Stay: Payer: Medicare Other

## 2016-04-17 DIAGNOSIS — M19041 Primary osteoarthritis, right hand: Secondary | ICD-10-CM

## 2016-04-17 DIAGNOSIS — N189 Chronic kidney disease, unspecified: Secondary | ICD-10-CM

## 2016-04-17 DIAGNOSIS — K219 Gastro-esophageal reflux disease without esophagitis: Secondary | ICD-10-CM

## 2016-04-17 DIAGNOSIS — M810 Age-related osteoporosis without current pathological fracture: Secondary | ICD-10-CM

## 2016-04-17 DIAGNOSIS — M19042 Primary osteoarthritis, left hand: Secondary | ICD-10-CM

## 2016-04-17 DIAGNOSIS — E785 Hyperlipidemia, unspecified: Secondary | ICD-10-CM

## 2016-04-17 DIAGNOSIS — Z79899 Other long term (current) drug therapy: Secondary | ICD-10-CM

## 2016-04-17 LAB — FERRITIN: Ferritin: 114 ng/mL (ref 11–307)

## 2016-04-17 LAB — CBC WITH DIFFERENTIAL/PLATELET
Basophils Absolute: 0 10*3/uL (ref 0–0.1)
Basophils Relative: 0 %
Eosinophils Absolute: 0.1 10*3/uL (ref 0–0.7)
Eosinophils Relative: 2 %
HEMATOCRIT: 36.4 % (ref 35.0–47.0)
HEMOGLOBIN: 12.7 g/dL (ref 12.0–16.0)
LYMPHS ABS: 1.6 10*3/uL (ref 1.0–3.6)
LYMPHS PCT: 24 %
MCH: 33.3 pg (ref 26.0–34.0)
MCHC: 34.9 g/dL (ref 32.0–36.0)
MCV: 95.4 fL (ref 80.0–100.0)
MONO ABS: 0.6 10*3/uL (ref 0.2–0.9)
MONOS PCT: 9 %
NEUTROS ABS: 4.4 10*3/uL (ref 1.4–6.5)
Neutrophils Relative %: 65 %
Platelets: 305 10*3/uL (ref 150–440)
RBC: 3.81 MIL/uL (ref 3.80–5.20)
RDW: 12.6 % (ref 11.5–14.5)
WBC: 6.7 10*3/uL (ref 3.6–11.0)

## 2016-04-17 NOTE — Progress Notes (Signed)
Offers no complaints  

## 2016-04-24 NOTE — Progress Notes (Signed)
[place]  Telephone:(336) [phone] [phone]) [phone]  ID: Judith Kim OB: 03/17/1936  MR#: [id number]  [id number]  Patient Care Team: Glendon Axe, MD as PCP - General (Internal Medicine)  CHIEF COMPLAINT: Hemachromatosis.  INTERVAL HISTORY: Patient returns to clinic today for repeat laboratory work, further evaluation, and consideration of additional phlebotomy. She continues to feel well and is asymptomatic.  She has no neurologic complaints.  She has had no recent fevers or illnesses.  She has a good appetite and denies weight loss.  She has no chest pain or shortness of breath.  She denies any nausea, vomiting, constipation, or diarrhea.  She has no urinary complaints.  Patient offers no specific complaints today.   REVIEW OF SYSTEMS:   Review of Systems  Constitutional: Negative.  Negative for malaise/fatigue.  Respiratory: Negative.  Negative for shortness of breath.   Cardiovascular: Negative.  Negative for chest pain.  Gastrointestinal: Negative.   Musculoskeletal: Negative.   Neurological: Negative.  Negative for weakness.    As per HPI. Otherwise, a complete review of systems is negatve.  PAST MEDICAL HISTORY: Past Medical History:  Diagnosis Date  . Anemia   . Anxiety   . Cataract cortical, senile   . Chronic kidney disease   . Colon polyp   . Constipation   . Depression   . GERD (gastroesophageal reflux disease)   . H/O hematuria   . Hemochromatosis   . Hyperlipidemia    since 10/2013,  Dr. Candiss Norse has cleared her from meds and reported to her her levels are [facility].   . Incontinence in female   . Osteoarthritis of both hands   . Osteopenia   . Osteoporosis   . Overactive bladder   . Stress incontinence   . Vaginal atrophy     PAST SURGICAL HISTORY: Past Surgical History:  Procedure Laterality Date  . CATARACT EXTRACTION    . COLONOSCOPY    . COLONOSCOPY WITH PROPOFOL N/A 03/28/2015   Procedure: COLONOSCOPY WITH PROPOFOL;   Surgeon: Manya Silvas, MD;  Location: [facility] ENDOSCOPY;  Service: Endoscopy;  Laterality: N/A;  . ESOPHAGOGASTRODUODENOSCOPY    . FLEXIBLE SIGMOIDOSCOPY    . TONSILLECTOMY      FAMILY HISTORY Family History  Problem Relation Age of Onset  . Bipolar disorder Father   . Cancer Mother   . Kidney disease Mother   . Bladder Cancer Mother   . Breast cancer Neg Hx        ADVANCED DIRECTIVES:    HEALTH MAINTENANCE: Social History  Substance Use Topics  . Smoking status: Never Smoker  . Smokeless tobacco: Not on file  . Alcohol use 21.0 oz/week    35 Glasses of wine per week     Colonoscopy:  PAP:  Bone density:  Lipid panel:  Allergies  Allergen Reactions  . Nitrofurantoin Macrocrystal Hives    Current Outpatient Prescriptions  Medication Sig Dispense Refill  . B Complex-C (SUPER B COMPLEX PO) Take 1 capsule by mouth daily before breakfast.    . Calcium-Vitamin D 600-200 MG-UNIT tablet Take 1 tablet by mouth 2 (two) times daily.    . Cholecalciferol (VITAMIN D3) 5000 UNITS CAPS Take 1 capsule by mouth daily.    . diphenhydrAMINE (BENADRYL) 50 MG capsule Take 50 mg by mouth at bedtime as needed.     . docusate sodium (COLACE) 100 MG capsule Take 300 mg by mouth at bedtime.     . Flaxseed, Linseed, (FLAXSEED OIL) 1000 MG CAPS Take 1  capsule by mouth 2 (two) times daily.     . Multiple Vitamin (MULTI-VITAMINS) TABS Take 1 tablet by mouth daily.     . Omega 3 1200 MG CAPS Take 1 capsule by mouth daily.    . polycarbophil (FIBERCON) 625 MG tablet Take 1,250 mg by mouth daily.     . Probiotic Product (PROBIOTIC & ACIDOPHILUS EX ST PO) Take 1 capsule by mouth daily after supper.     . vitamin E (VITAMIN E) 400 UNIT capsule Take 400 Units by mouth daily after supper.     No current facility-administered medications for this visit.     OBJECTIVE: Vitals:   04/17/16 1415  BP: 121/70  Pulse: (!) 59  Resp: 18  Temp: 98 F (36.7 C)     Body mass index is 20.71 kg/m.     ECOG FS:0 - Asymptomatic  General: Well-developed, well-nourished, no acute distress. Eyes: anicteric sclera. Lungs: Clear to auscultation bilaterally. Heart: Regular rate and rhythm. No rubs, murmurs, or gallops. Abdomen: Soft, nontender, nondistended. No organomegaly noted, normoactive bowel sounds. Musculoskeletal: No edema, cyanosis, or clubbing. Neuro: Alert, answering all questions appropriately. Cranial nerves grossly intact. Skin: No rashes or petechiae noted. Psych: Normal affect.   LAB RESULTS:  Lab Results  Component Value Date   PROT 7.0 06/14/2014   ALBUMIN 4.0 06/14/2014   AST 17 06/14/2014   ALT 20 06/14/2014   ALKPHOS 83 06/14/2014   BILITOT 0.4 06/14/2014    Lab Results  Component Value Date   WBC 6.7 04/17/2016   NEUTROABS 4.4 04/17/2016   HGB 12.7 04/17/2016   HCT 36.4 04/17/2016   MCV 95.4 04/17/2016   PLT 305 04/17/2016   Lab Results  Component Value Date   FERRITIN 114 04/17/2016      STUDIES: No results found.  ASSESSMENT: Hemochromatosis.  PLAN:    1. Hemachromatatosis: Patient's ferritin is 114 today. Her goal is 50-100, therefore she will receive 200 mL phlebotomy again today. She continues to only needs phlebotomy every 3 months.  Patient will return to clinic in 3 months for repeat laboratory work and consideration of phlebotomy and then in 6 months for further evaluation.  2. Hemoglobin: Within normal limits. Continue to monitor while undergoing phlebotomy.  Patient expressed understanding and was in agreement with this plan. She also understands that She can call clinic at any time with any questions, concerns, or complaints.    Lloyd Huger, MD   04/24/2016 10:32 PM

## 2016-07-16 ENCOUNTER — Other Ambulatory Visit: Payer: Self-pay | Admitting: *Deleted

## 2016-07-18 ENCOUNTER — Inpatient Hospital Stay: Payer: Medicare Other

## 2016-07-18 ENCOUNTER — Inpatient Hospital Stay: Payer: Medicare Other | Attending: Oncology

## 2016-07-18 DIAGNOSIS — Z862 Personal history of diseases of the blood and blood-forming organs and certain disorders involving the immune mechanism: Secondary | ICD-10-CM

## 2016-07-18 LAB — CBC WITH DIFFERENTIAL/PLATELET
Basophils Absolute: 0 10*3/uL (ref 0–0.1)
Basophils Relative: 1 %
Eosinophils Absolute: 0.1 10*3/uL (ref 0–0.7)
Eosinophils Relative: 1 %
HEMATOCRIT: 36.2 % (ref 35.0–47.0)
Hemoglobin: 12.6 g/dL (ref 12.0–16.0)
LYMPHS PCT: 28 %
Lymphs Abs: 1.5 10*3/uL (ref 1.0–3.6)
MCH: 32.8 pg (ref 26.0–34.0)
MCHC: 34.8 g/dL (ref 32.0–36.0)
MCV: 94.4 fL (ref 80.0–100.0)
MONO ABS: 0.5 10*3/uL (ref 0.2–0.9)
MONOS PCT: 9 %
NEUTROS ABS: 3.2 10*3/uL (ref 1.4–6.5)
Neutrophils Relative %: 61 %
Platelets: 315 10*3/uL (ref 150–440)
RBC: 3.83 MIL/uL (ref 3.80–5.20)
RDW: 12.5 % (ref 11.5–14.5)
WBC: 5.2 10*3/uL (ref 3.6–11.0)

## 2016-07-18 LAB — FERRITIN: Ferritin: 98 ng/mL (ref 11–307)

## 2016-10-15 NOTE — Progress Notes (Deleted)
[place]  [phone]) [phone] [phone]) [phone]  ID: Judith Kim OB: 04/06/1936  MR#: [id number]  [id number]  Patient Care Team: Glendon Axe, MD as PCP - General (Internal Medicine)  CHIEF COMPLAINT: Hereditary hemochromatosis.  INTERVAL HISTORY: Patient returns to clinic today for repeat laboratory work, further evaluation, and consideration of additional phlebotomy. She continues to feel well and is asymptomatic.  She has no neurologic complaints.  She has had no recent fevers or illnesses.  She has a good appetite and denies weight loss.  She has no chest pain or shortness of breath.  She denies any nausea, vomiting, constipation, or diarrhea.  She has no urinary complaints.  Patient offers no specific complaints today.   REVIEW OF SYSTEMS:   Review of Systems  Constitutional: Negative.  Negative for malaise/fatigue.  Respiratory: Negative.  Negative for shortness of breath.   Cardiovascular: Negative.  Negative for chest pain.  Gastrointestinal: Negative.   Musculoskeletal: Negative.   Neurological: Negative.  Negative for weakness.    As per HPI. Otherwise, a complete review of systems is negatve.  PAST MEDICAL HISTORY: Past Medical History:  Diagnosis Date  . Anemia   . Anxiety   . Cataract cortical, senile   . Chronic kidney disease   . Colon polyp   . Constipation   . Depression   . GERD (gastroesophageal reflux disease)   . H/O hematuria   . Hemochromatosis   . Hyperlipidemia    since 10/2013,  Dr. Candiss Norse has cleared her from meds and reported to her her levels are [facility].   . Incontinence in female   . Osteoarthritis of both hands   . Osteopenia   . Osteoporosis   . Overactive bladder   . Stress incontinence   . Vaginal atrophy     PAST SURGICAL HISTORY: Past Surgical History:  Procedure Laterality Date  . CATARACT EXTRACTION    . COLONOSCOPY    . COLONOSCOPY WITH PROPOFOL N/A 03/28/2015   Procedure: COLONOSCOPY WITH  PROPOFOL;  Surgeon: Manya Silvas, MD;  Location: [facility] ENDOSCOPY;  Service: Endoscopy;  Laterality: N/A;  . ESOPHAGOGASTRODUODENOSCOPY    . FLEXIBLE SIGMOIDOSCOPY    . TONSILLECTOMY      FAMILY HISTORY Family History  Problem Relation Age of Onset  . Bipolar disorder Father   . Cancer Mother   . Kidney disease Mother   . Bladder Cancer Mother   . Breast cancer Neg Hx        ADVANCED DIRECTIVES:    HEALTH MAINTENANCE: Social History  Substance Use Topics  . Smoking status: Never Smoker  . Smokeless tobacco: Not on file  . Alcohol use 21.0 oz/week    35 Glasses of wine per week     Colonoscopy:  PAP:  Bone density:  Lipid panel:  Allergies  Allergen Reactions  . Nitrofurantoin Macrocrystal Hives    Current Outpatient Prescriptions  Medication Sig Dispense Refill  . B Complex-C (SUPER B COMPLEX PO) Take 1 capsule by mouth daily before breakfast.    . Calcium-Vitamin D 600-200 MG-UNIT tablet Take 1 tablet by mouth 2 (two) times daily.    . Cholecalciferol (VITAMIN D3) 5000 UNITS CAPS Take 1 capsule by mouth daily.    . diphenhydrAMINE (BENADRYL) 50 MG capsule Take 50 mg by mouth at bedtime as needed.     . docusate sodium (COLACE) 100 MG capsule Take 300 mg by mouth at bedtime.     . Flaxseed, Linseed, (FLAXSEED OIL) 1000 MG CAPS Take  1 capsule by mouth 2 (two) times daily.     . Multiple Vitamin (MULTI-VITAMINS) TABS Take 1 tablet by mouth daily.     . Omega 3 1200 MG CAPS Take 1 capsule by mouth daily.    . polycarbophil (FIBERCON) 625 MG tablet Take 1,250 mg by mouth daily.     . Probiotic Product (PROBIOTIC & ACIDOPHILUS EX ST PO) Take 1 capsule by mouth daily after supper.     . vitamin E (VITAMIN E) 400 UNIT capsule Take 400 Units by mouth daily after supper.     No current facility-administered medications for this visit.     OBJECTIVE: There were no vitals filed for this visit.   There is no height or weight on file to calculate BMI.    ECOG FS:0 -  Asymptomatic  General: Well-developed, well-nourished, no acute distress. Eyes: anicteric sclera. Lungs: Clear to auscultation bilaterally. Heart: Regular rate and rhythm. No rubs, murmurs, or gallops. Abdomen: Soft, nontender, nondistended. No organomegaly noted, normoactive bowel sounds. Musculoskeletal: No edema, cyanosis, or clubbing. Neuro: Alert, answering all questions appropriately. Cranial nerves grossly intact. Skin: No rashes or petechiae noted. Psych: Normal affect.   LAB RESULTS:  Lab Results  Component Value Date   PROT 7.0 06/14/2014   ALBUMIN 4.0 06/14/2014   AST 17 06/14/2014   ALT 20 06/14/2014   ALKPHOS 83 06/14/2014   BILITOT 0.4 06/14/2014    Lab Results  Component Value Date   WBC 5.2 07/18/2016   NEUTROABS 3.2 07/18/2016   HGB 12.6 07/18/2016   HCT 36.2 07/18/2016   MCV 94.4 07/18/2016   PLT 315 07/18/2016   Lab Results  Component Value Date   FERRITIN 98 07/18/2016      STUDIES: No results found.  ASSESSMENT: Hereditary hemochromatosis.  PLAN:    1. Hereditary hemochromatosis: Patient's ferritin is 114 today. Her goal is 50-100, therefore she will receive 200 mL phlebotomy again today. She continues to only needs phlebotomy every 3 months.  Patient will return to clinic in 3 months for repeat laboratory work and consideration of phlebotomy and then in 6 months for further evaluation.  2. Hemoglobin: Within normal limits. Continue to monitor while undergoing phlebotomy.  Patient expressed understanding and was in agreement with this plan. She also understands that She can call clinic at any time with any questions, concerns, or complaints.    Lloyd Huger, MD   10/15/2016 10:53 PM

## 2016-10-18 ENCOUNTER — Inpatient Hospital Stay: Payer: Medicare Other | Admitting: Oncology

## 2016-10-18 ENCOUNTER — Inpatient Hospital Stay: Payer: Medicare Other

## 2016-10-23 ENCOUNTER — Other Ambulatory Visit: Payer: Self-pay | Admitting: Internal Medicine

## 2016-10-23 DIAGNOSIS — Z1239 Encounter for other screening for malignant neoplasm of breast: Secondary | ICD-10-CM

## 2016-10-23 DIAGNOSIS — R3129 Other microscopic hematuria: Secondary | ICD-10-CM | POA: Insufficient documentation

## 2016-10-29 NOTE — Progress Notes (Signed)
[place]  Telephone:(336) [phone] Fax:(336) [phone]  ID: [name] OB: [birth year]  MR#: [id number]  [id number]  Patient Care Team: Glendon Axe, MD as PCP - General (Internal Medicine)  CHIEF COMPLAINT: Hereditary hemochromatosis.  INTERVAL HISTORY: Patient returns to clinic today for repeat laboratory work, further evaluation, and consideration of additional phlebotomy. She continues to feel well and is asymptomatic. She reports no weakness or fatigue. She reports occasional blurry vision if she allows herself to get too tired.  This is controlled with an early bedtime. She does report a history of falls, last in May 2016 when she broke her pelvis. She had siurgery and it has healed well.  She walks on her treadmill 20 minutes a day, and participates in body sculpting class at [facility] weekly.  She reports dry, thinning skin, kept moist with daily lotion.  She has no neurologic complaints.  She has had no recent fevers, chills, or illnesses.  She has a good appetite and denies weight loss.  She has no chest pain or shortness of breath.  She reports constipation controlled with laxative and stool softener, being followed by GI . She denies any nausea, vomiting, or diarrhea.  She reports stress incontinence at baseline, resolved bladder infection from October, no other urinary complaints.  She reports occasional insomnia, difficulty getting back to sleep after early awakening. Patient offers no further specific complaints today.   REVIEW OF SYSTEMS:   Review of Systems  Constitutional: Negative.  Negative for malaise/fatigue.  HENT: Positive for congestion.   Eyes: Positive for blurred vision.  Respiratory: Negative.  Negative for shortness of breath.   Cardiovascular: Negative.  Negative for chest pain.  Gastrointestinal: Negative.  Negative for constipation, diarrhea, nausea and vomiting.  Genitourinary: Negative for frequency, hematuria and urgency.        Positive for stress incontinence  Musculoskeletal: Positive for falls. Negative for back pain and myalgias.  Skin: Negative for itching and rash.       Positive for dry skin  Neurological: Negative.  Negative for tingling, sensory change, weakness and headaches.  Psychiatric/Behavioral: The patient has insomnia.     As per HPI. Otherwise, a complete review of systems is negative.  PAST MEDICAL HISTORY: Past Medical History:  Diagnosis Date  . Anemia   . Anxiety   . Cataract cortical, senile   . Chronic kidney disease   . Colon polyp   . Constipation   . Depression   . GERD (gastroesophageal reflux disease)   . H/O hematuria   . Hemochromatosis   . Hyperlipidemia    since 10/2013,  Dr. Candiss Norse has cleared her from meds and reported to her her levels are [facility].   . Incontinence in female   . Osteoarthritis of both hands   . Osteopenia   . Osteoporosis   . Overactive bladder   . Stress incontinence   . Vaginal atrophy     PAST SURGICAL HISTORY: Past Surgical History:  Procedure Laterality Date  . CATARACT EXTRACTION    . COLONOSCOPY    . COLONOSCOPY WITH PROPOFOL N/A 03/28/2015   Procedure: COLONOSCOPY WITH PROPOFOL;  Surgeon: Manya Silvas, MD;  Location: [facility] ENDOSCOPY;  Service: Endoscopy;  Laterality: N/A;  . ESOPHAGOGASTRODUODENOSCOPY    . FLEXIBLE SIGMOIDOSCOPY    . TONSILLECTOMY      FAMILY HISTORY Family History  Problem Relation Age of Onset  . Bipolar disorder Father   . Cancer Mother   . Kidney disease Mother   .  Bladder Cancer Mother   . Breast cancer Neg Hx        ADVANCED DIRECTIVES:    HEALTH MAINTENANCE: Social History  Substance Use Topics  . Smoking status: Never Smoker  . Smokeless tobacco: Not on file  . Alcohol use 21.0 oz/week    35 Glasses of wine per week     Colonoscopy:  PAP:  Bone density:  Lipid panel:  Allergies  Allergen Reactions  . Nitrofurantoin Macrocrystal Hives    Current Outpatient Prescriptions    Medication Sig Dispense Refill  . B Complex-C (SUPER B COMPLEX PO) Take 1 capsule by mouth daily before breakfast.    . Calcium-Vitamin D 600-200 MG-UNIT tablet Take 1 tablet by mouth 2 (two) times daily.    . Cholecalciferol (VITAMIN D3) 5000 UNITS CAPS Take 1 capsule by mouth daily.    . diphenhydrAMINE (BENADRYL) 50 MG capsule Take 50 mg by mouth at bedtime as needed.     . docusate sodium (COLACE) 100 MG capsule Take 300 mg by mouth at bedtime.     . Flaxseed, Linseed, (FLAXSEED OIL) 1000 MG CAPS Take 1 capsule by mouth 2 (two) times daily.     . Multiple Vitamin (MULTI-VITAMINS) TABS Take 1 tablet by mouth daily.     . Omega 3 1200 MG CAPS Take 1 capsule by mouth daily.    . polycarbophil (FIBERCON) 625 MG tablet Take 1,250 mg by mouth daily.     . Probiotic Product (PROBIOTIC & ACIDOPHILUS EX ST PO) Take 1 capsule by mouth daily after supper.     . vitamin E (VITAMIN E) 400 UNIT capsule Take 400 Units by mouth daily after supper.     No current facility-administered medications for this visit.     OBJECTIVE: Vitals:   10/31/16 1343  BP: 133/61  Pulse: 63  Resp: 18  Temp: 98.3 F (36.8 C)     Body mass index is 20.46 kg/m.    ECOG FS:0 - Asymptomatic  General: Well-developed, well-nourished, no acute distress. Eyes: anicteric sclera. Lungs: Clear to auscultation bilaterally. Heart: Regular rate and rhythm. No rubs, murmurs, or gallops. Abdomen: Soft, nontender, nondistended. No organomegaly noted, normoactive bowel sounds. Musculoskeletal: No edema, cyanosis, or clubbing. Neuro: Alert, answering all questions appropriately. Cranial nerves grossly intact. Skin: No rashes or petechiae noted. Psych: Normal affect.   LAB RESULTS:  Lab Results  Component Value Date   PROT 7.0 06/14/2014   ALBUMIN 4.0 06/14/2014   AST 17 06/14/2014   ALT 20 06/14/2014   ALKPHOS 83 06/14/2014   BILITOT 0.4 06/14/2014    Lab Results  Component Value Date   WBC 5.7 10/31/2016    NEUTROABS 4.1 10/31/2016   HGB 12.3 10/31/2016   HCT 35.8 10/31/2016   MCV 95.1 10/31/2016   PLT 299 10/31/2016   Lab Results  Component Value Date   FERRITIN 109 10/31/2016      STUDIES: No results found.  ASSESSMENT: Hereditary hemochromatosis.  PLAN:    1. Hereditary hemochromatosis: Patient's ferritin is 109 today. Her goal is 50-100, therefore she will receive 200 mL phlebotomy again today. She continues to only need phlebotomy every 3 months.  Patient will return to clinic in 3 months for repeat laboratory work and consideration of phlebotomy and then in 6 months for further evaluation.  2. Hemoglobin: Within normal limits. Continue to monitor while undergoing phlebotomy. 3. Falls: Discussed pausing between position changes to allow for sitting/lying back down if feeling dizzy or lightheaded. 4. Insomnia/Anxiety:  Discussed 4-7-8 breathing technique at bedtime: breathe in to count of 4, hold breath for count of 7, exhale for count of 8; do 3-5 times for letting go of overactive thoughts.  Provided breath ratio chart to patient for relaxing, balancing, and energizing breathing techniques.   Patient expressed understanding and was in agreement with this plan. She also understands that She can call clinic at any time with any questions, concerns, or complaints.   Lucendia Herrlich, NP  10/31/16 3:03 PM  Patient was seen and evaluated independently and I agree with the assessment and plan as dictated above.  Lloyd Huger, MD 11/03/16 8:22 AM

## 2016-10-30 ENCOUNTER — Other Ambulatory Visit: Payer: Self-pay | Admitting: *Deleted

## 2016-10-31 ENCOUNTER — Inpatient Hospital Stay (HOSPITAL_BASED_OUTPATIENT_CLINIC_OR_DEPARTMENT_OTHER): Payer: Medicare Other | Admitting: Oncology

## 2016-10-31 ENCOUNTER — Inpatient Hospital Stay: Payer: Medicare Other | Attending: Oncology

## 2016-10-31 ENCOUNTER — Inpatient Hospital Stay: Payer: Medicare Other

## 2016-10-31 DIAGNOSIS — Z8052 Family history of malignant neoplasm of bladder: Secondary | ICD-10-CM

## 2016-10-31 DIAGNOSIS — Z79899 Other long term (current) drug therapy: Secondary | ICD-10-CM

## 2016-10-31 DIAGNOSIS — Z9181 History of falling: Secondary | ICD-10-CM

## 2016-10-31 DIAGNOSIS — N393 Stress incontinence (female) (male): Secondary | ICD-10-CM | POA: Insufficient documentation

## 2016-10-31 DIAGNOSIS — Z86010 Personal history of colonic polyps: Secondary | ICD-10-CM | POA: Diagnosis not present

## 2016-10-31 DIAGNOSIS — K5900 Constipation, unspecified: Secondary | ICD-10-CM | POA: Insufficient documentation

## 2016-10-31 DIAGNOSIS — M1990 Unspecified osteoarthritis, unspecified site: Secondary | ICD-10-CM

## 2016-10-31 DIAGNOSIS — Z862 Personal history of diseases of the blood and blood-forming organs and certain disorders involving the immune mechanism: Secondary | ICD-10-CM

## 2016-10-31 DIAGNOSIS — E785 Hyperlipidemia, unspecified: Secondary | ICD-10-CM | POA: Diagnosis not present

## 2016-10-31 DIAGNOSIS — N3281 Overactive bladder: Secondary | ICD-10-CM

## 2016-10-31 DIAGNOSIS — N189 Chronic kidney disease, unspecified: Secondary | ICD-10-CM | POA: Insufficient documentation

## 2016-10-31 DIAGNOSIS — F418 Other specified anxiety disorders: Secondary | ICD-10-CM | POA: Diagnosis not present

## 2016-10-31 DIAGNOSIS — G4700 Insomnia, unspecified: Secondary | ICD-10-CM | POA: Insufficient documentation

## 2016-10-31 DIAGNOSIS — K219 Gastro-esophageal reflux disease without esophagitis: Secondary | ICD-10-CM

## 2016-10-31 DIAGNOSIS — H538 Other visual disturbances: Secondary | ICD-10-CM | POA: Diagnosis not present

## 2016-10-31 LAB — CBC WITH DIFFERENTIAL/PLATELET
Basophils Absolute: 0 10*3/uL (ref 0–0.1)
Basophils Relative: 1 %
Eosinophils Absolute: 0.1 10*3/uL (ref 0–0.7)
Eosinophils Relative: 1 %
HCT: 35.8 % (ref 35.0–47.0)
Hemoglobin: 12.3 g/dL (ref 12.0–16.0)
Lymphocytes Relative: 19 %
Lymphs Abs: 1.1 10*3/uL (ref 1.0–3.6)
MCH: 32.8 pg (ref 26.0–34.0)
MCHC: 34.5 g/dL (ref 32.0–36.0)
MCV: 95.1 fL (ref 80.0–100.0)
Monocytes Absolute: 0.5 10*3/uL (ref 0.2–0.9)
Monocytes Relative: 9 %
Neutro Abs: 4.1 10*3/uL (ref 1.4–6.5)
Neutrophils Relative %: 70 %
Platelets: 299 10*3/uL (ref 150–440)
RBC: 3.76 MIL/uL — ABNORMAL LOW (ref 3.80–5.20)
RDW: 12.5 % (ref 11.5–14.5)
WBC: 5.7 10*3/uL (ref 3.6–11.0)

## 2016-10-31 LAB — FERRITIN: Ferritin: 109 ng/mL (ref 11–307)

## 2016-10-31 NOTE — Progress Notes (Signed)
Patient is here for follow up, she is doing well no complaints.  

## 2017-01-15 ENCOUNTER — Ambulatory Visit
Admission: RE | Admit: 2017-01-15 | Discharge: 2017-01-15 | Disposition: A | Discharge status: Home / Self Care | Payer: Medicare Other | Source: Ambulatory Visit | Attending: Internal Medicine | Admitting: Internal Medicine

## 2017-01-15 DIAGNOSIS — Z1239 Encounter for other screening for malignant neoplasm of breast: Secondary | ICD-10-CM

## 2017-01-15 DIAGNOSIS — Z1231 Encounter for screening mammogram for malignant neoplasm of breast: Secondary | ICD-10-CM | POA: Insufficient documentation

## 2017-01-23 ENCOUNTER — Inpatient Hospital Stay: Payer: Medicare Other

## 2017-01-23 ENCOUNTER — Inpatient Hospital Stay: Payer: Medicare Other | Attending: Oncology

## 2017-01-23 LAB — CBC WITH DIFFERENTIAL/PLATELET
BASOS ABS: 0 10*3/uL (ref 0–0.1)
Basophils Relative: 1 %
Eosinophils Absolute: 0.1 10*3/uL (ref 0–0.7)
Eosinophils Relative: 2 %
HEMATOCRIT: 36.7 % (ref 35.0–47.0)
Hemoglobin: 12.7 g/dL (ref 12.0–16.0)
LYMPHS ABS: 1.3 10*3/uL (ref 1.0–3.6)
LYMPHS PCT: 25 %
MCH: 33.3 pg (ref 26.0–34.0)
MCHC: 34.7 g/dL (ref 32.0–36.0)
MCV: 95.8 fL (ref 80.0–100.0)
MONO ABS: 0.4 10*3/uL (ref 0.2–0.9)
Monocytes Relative: 8 %
Neutro Abs: 3.5 10*3/uL (ref 1.4–6.5)
Neutrophils Relative %: 64 %
Platelets: 332 10*3/uL (ref 150–440)
RBC: 3.83 MIL/uL (ref 3.80–5.20)
RDW: 12.9 % (ref 11.5–14.5)
WBC: 5.4 10*3/uL (ref 3.6–11.0)

## 2017-01-23 LAB — IRON AND TIBC
Iron: 115 ug/dL (ref 28–170)
SATURATION RATIOS: 52 % — AB (ref 10.4–31.8)
TIBC: 220 ug/dL — AB (ref 250–450)
UIBC: 105 ug/dL

## 2017-01-23 LAB — FERRITIN: Ferritin: 92 ng/mL (ref 11–307)

## 2017-04-23 ENCOUNTER — Other Ambulatory Visit: Payer: Self-pay

## 2017-04-23 DIAGNOSIS — Z862 Personal history of diseases of the blood and blood-forming organs and certain disorders involving the immune mechanism: Secondary | ICD-10-CM

## 2017-04-23 NOTE — Progress Notes (Signed)
[place]  Telephone:(336) [phone] Fax:(336) [phone]  ID: [name] OB: 05/10/1936  MR#: [id number]  [id number]#:[id number]  Patient Care Team: Glendon Axe, MD as PCP - General (Internal Medicine)  CHIEF COMPLAINT: Hereditary hemochromatosis.  INTERVAL HISTORY: Patient returns to clinic today for repeat laboratory work, further evaluation, and consideration of additional phlebotomy. She continues to feel well and is asymptomatic. She has no neurologic complaints.  She denies any recent fevers, chills, or illnesses.  She has a good appetite and denies weight loss.  She has no chest pain or shortness of breath.  She denies any nausea, vomiting, or diarrhea.  She has no urinary complaints.  Patient offers no specific complaints today.   REVIEW OF SYSTEMS:   Review of Systems  Constitutional: Negative.  Negative for malaise/fatigue.  HENT: Negative for congestion.   Eyes: Negative for blurred vision.  Respiratory: Negative.  Negative for shortness of breath.   Cardiovascular: Negative.  Negative for chest pain.  Gastrointestinal: Negative.  Negative for constipation, diarrhea, nausea and vomiting.  Genitourinary: Negative for frequency, hematuria and urgency.  Musculoskeletal: Negative for back pain, falls and myalgias.  Skin: Negative for itching and rash.  Neurological: Negative.  Negative for tingling, sensory change, weakness and headaches.  Psychiatric/Behavioral: Negative.  The patient does not have insomnia.     As per HPI. Otherwise, a complete review of systems is negative.  PAST MEDICAL HISTORY: Past Medical History:  Diagnosis Date  . Anemia   . Anxiety   . Cataract cortical, senile   . Chronic kidney disease   . Colon polyp   . Constipation   . Depression   . GERD (gastroesophageal reflux disease)   . H/O hematuria   . Hemochromatosis   . Hyperlipidemia    since 10/2013,  Dr. Candiss Norse has cleared her from meds and reported to her her levels  are [facility].   . Incontinence in female   . Osteoarthritis of both hands   . Osteopenia   . Osteoporosis   . Overactive bladder   . Stress incontinence   . Vaginal atrophy     PAST SURGICAL HISTORY: Past Surgical History:  Procedure Laterality Date  . CATARACT EXTRACTION    . COLONOSCOPY    . COLONOSCOPY WITH PROPOFOL N/A 03/28/2015   Procedure: COLONOSCOPY WITH PROPOFOL;  Surgeon: Manya Silvas, MD;  Location: [facility] ENDOSCOPY;  Service: Endoscopy;  Laterality: N/A;  . ESOPHAGOGASTRODUODENOSCOPY    . FLEXIBLE SIGMOIDOSCOPY    . TONSILLECTOMY      FAMILY HISTORY Family History  Problem Relation Age of Onset  . Bipolar disorder Father   . Cancer Mother   . Kidney disease Mother   . Bladder Cancer Mother   . Breast cancer Neg Hx        ADVANCED DIRECTIVES:    HEALTH MAINTENANCE: Social History  Substance Use Topics  . Smoking status: Never Smoker  . Smokeless tobacco: Not on file  . Alcohol use 21.0 oz/week    35 Glasses of wine per week     Colonoscopy:  PAP:  Bone density:  Lipid panel:  Allergies  Allergen Reactions  . Nitrofurantoin Macrocrystal Hives    Current Outpatient Prescriptions  Medication Sig Dispense Refill  . B Complex-C (SUPER B COMPLEX PO) Take 1 capsule by mouth daily before breakfast.    . Calcium-Vitamin D 600-200 MG-UNIT tablet Take 1 tablet by mouth 2 (two) times daily.    . Cholecalciferol (VITAMIN D3) 5000 UNITS CAPS Take 1  capsule by mouth daily.    . diphenhydrAMINE (BENADRYL) 50 MG capsule Take 50 mg by mouth at bedtime as needed.     . docusate sodium (COLACE) 100 MG capsule Take 300 mg by mouth at bedtime.     . Flaxseed, Linseed, (FLAXSEED OIL) 1000 MG CAPS Take 1 capsule by mouth 2 (two) times daily.     . Multiple Vitamin (MULTI-VITAMINS) TABS Take 1 tablet by mouth daily.     . Omega 3 1200 MG CAPS Take 1 capsule by mouth daily.    . polycarbophil (FIBERCON) 625 MG tablet Take 1,250 mg by mouth daily.     . Probiotic  Product (PROBIOTIC & ACIDOPHILUS EX ST PO) Take 1 capsule by mouth daily after supper.     . Turmeric Curcumin 500 MG CAPS Take 1 capsule by mouth daily.    . vitamin E (VITAMIN E) 400 UNIT capsule Take 400 Units by mouth daily after supper.     No current facility-administered medications for this visit.     OBJECTIVE: Vitals:   04/24/17 1411  BP: 137/77  Pulse: 78  Resp: 18  Temp: 98.3 F (36.8 C)     Body mass index is 20.93 kg/m.    ECOG FS:0 - Asymptomatic  General: Well-developed, well-nourished, no acute distress. Eyes: anicteric sclera. Lungs: Clear to auscultation bilaterally. Heart: Regular rate and rhythm. No rubs, murmurs, or gallops. Abdomen: Soft, nontender, nondistended. No organomegaly noted, normoactive bowel sounds. Musculoskeletal: No edema, cyanosis, or clubbing. Neuro: Alert, answering all questions appropriately. Cranial nerves grossly intact. Skin: No rashes or petechiae noted. Psych: Normal affect.   LAB RESULTS:  Lab Results  Component Value Date   PROT 7.0 06/14/2014   ALBUMIN 4.0 06/14/2014   AST 17 06/14/2014   ALT 20 06/14/2014   ALKPHOS 83 06/14/2014   BILITOT 0.4 06/14/2014    Lab Results  Component Value Date   WBC 5.6 04/24/2017   NEUTROABS 3.5 04/24/2017   HGB 12.5 04/24/2017   HCT 35.3 04/24/2017   MCV 94.0 04/24/2017   PLT 329 04/24/2017   Lab Results  Component Value Date   FERRITIN 107 04/24/2017      STUDIES: No results found.  ASSESSMENT: Hereditary hemochromatosis, by report heterozygote.  PLAN:    1. Hereditary hemochromatosis: Patient's ferritin is 107 today. Her goal is 50-100, therefore she will receive 200 mL phlebotomy again today. She continues to only need phlebotomy every 3 months.  Patient will return to clinic in 3 months for repeat laboratory work and consideration of phlebotomy and then in 6 months for further evaluation.  2. Hemoglobin: Within normal limits. Continue to monitor while undergoing  phlebotomy.   Patient expressed understanding and was in agreement with this plan. She also understands that She can call clinic at any time with any questions, concerns, or complaints.    Lloyd Huger, MD 04/26/17 1:22 PM

## 2017-04-24 ENCOUNTER — Inpatient Hospital Stay: Payer: Medicare Other | Attending: Oncology

## 2017-04-24 ENCOUNTER — Inpatient Hospital Stay (HOSPITAL_BASED_OUTPATIENT_CLINIC_OR_DEPARTMENT_OTHER): Payer: Medicare Other | Admitting: Oncology

## 2017-04-24 ENCOUNTER — Inpatient Hospital Stay: Payer: Medicare Other

## 2017-04-24 VITALS — BP 145/81 | HR 67 | Resp 18

## 2017-04-24 DIAGNOSIS — N189 Chronic kidney disease, unspecified: Secondary | ICD-10-CM

## 2017-04-24 DIAGNOSIS — Z79899 Other long term (current) drug therapy: Secondary | ICD-10-CM | POA: Insufficient documentation

## 2017-04-24 DIAGNOSIS — F418 Other specified anxiety disorders: Secondary | ICD-10-CM

## 2017-04-24 DIAGNOSIS — K219 Gastro-esophageal reflux disease without esophagitis: Secondary | ICD-10-CM

## 2017-04-24 DIAGNOSIS — E785 Hyperlipidemia, unspecified: Secondary | ICD-10-CM

## 2017-04-24 DIAGNOSIS — M8580 Other specified disorders of bone density and structure, unspecified site: Secondary | ICD-10-CM | POA: Insufficient documentation

## 2017-04-24 DIAGNOSIS — M1990 Unspecified osteoarthritis, unspecified site: Secondary | ICD-10-CM | POA: Insufficient documentation

## 2017-04-24 DIAGNOSIS — Z862 Personal history of diseases of the blood and blood-forming organs and certain disorders involving the immune mechanism: Secondary | ICD-10-CM

## 2017-04-24 DIAGNOSIS — Z8052 Family history of malignant neoplasm of bladder: Secondary | ICD-10-CM | POA: Insufficient documentation

## 2017-04-24 DIAGNOSIS — N3281 Overactive bladder: Secondary | ICD-10-CM

## 2017-04-24 LAB — CBC WITH DIFFERENTIAL/PLATELET
BASOS ABS: 0 10*3/uL (ref 0–0.1)
BASOS PCT: 1 %
Eosinophils Absolute: 0.1 10*3/uL (ref 0–0.7)
Eosinophils Relative: 1 %
HEMATOCRIT: 35.3 % (ref 35.0–47.0)
HEMOGLOBIN: 12.5 g/dL (ref 12.0–16.0)
LYMPHS PCT: 27 %
Lymphs Abs: 1.5 10*3/uL (ref 1.0–3.6)
MCH: 33.3 pg (ref 26.0–34.0)
MCHC: 35.4 g/dL (ref 32.0–36.0)
MCV: 94 fL (ref 80.0–100.0)
Monocytes Absolute: 0.5 10*3/uL (ref 0.2–0.9)
Monocytes Relative: 10 %
NEUTROS ABS: 3.5 10*3/uL (ref 1.4–6.5)
NEUTROS PCT: 61 %
Platelets: 329 10*3/uL (ref 150–440)
RBC: 3.76 MIL/uL — AB (ref 3.80–5.20)
RDW: 12.8 % (ref 11.5–14.5)
WBC: 5.6 10*3/uL (ref 3.6–11.0)

## 2017-04-24 LAB — FERRITIN: Ferritin: 107 ng/mL (ref 11–307)

## 2017-04-24 LAB — IRON AND TIBC
Iron: 123 ug/dL (ref 28–170)
SATURATION RATIOS: 53 % — AB (ref 10.4–31.8)
TIBC: 233 ug/dL — AB (ref 250–450)
UIBC: 110 ug/dL

## 2017-04-24 NOTE — Progress Notes (Signed)
Ferritin result pending at this time. Per MD, Dr. Grayland Ormond, order: proceed with scheduled phlebotomy.

## 2017-04-24 NOTE — Progress Notes (Signed)
Patient denies any concerns today.  

## 2017-07-17 ENCOUNTER — Inpatient Hospital Stay: Payer: Medicare Other

## 2017-07-17 ENCOUNTER — Inpatient Hospital Stay: Payer: Medicare Other | Attending: Oncology

## 2017-07-17 VITALS — BP 154/77 | HR 73 | Resp 20

## 2017-07-17 DIAGNOSIS — Z862 Personal history of diseases of the blood and blood-forming organs and certain disorders involving the immune mechanism: Secondary | ICD-10-CM

## 2017-07-17 LAB — CBC WITH DIFFERENTIAL/PLATELET
BASOS ABS: 0 10*3/uL (ref 0–0.1)
Basophils Relative: 1 %
EOS ABS: 0 10*3/uL (ref 0–0.7)
EOS PCT: 1 %
HCT: 36.5 % (ref 35.0–47.0)
Hemoglobin: 12.5 g/dL (ref 12.0–16.0)
Lymphocytes Relative: 26 %
Lymphs Abs: 1.4 10*3/uL (ref 1.0–3.6)
MCH: 33.2 pg (ref 26.0–34.0)
MCHC: 34.2 g/dL (ref 32.0–36.0)
MCV: 97.2 fL (ref 80.0–100.0)
Monocytes Absolute: 0.6 10*3/uL (ref 0.2–0.9)
Monocytes Relative: 11 %
Neutro Abs: 3.3 10*3/uL (ref 1.4–6.5)
Neutrophils Relative %: 61 %
PLATELETS: 313 10*3/uL (ref 150–440)
RBC: 3.76 MIL/uL — AB (ref 3.80–5.20)
RDW: 12.6 % (ref 11.5–14.5)
WBC: 5.4 10*3/uL (ref 3.6–11.0)

## 2017-07-17 LAB — IRON AND TIBC
Iron: 112 ug/dL (ref 28–170)
SATURATION RATIOS: 49 % — AB (ref 10.4–31.8)
TIBC: 228 ug/dL — ABNORMAL LOW (ref 250–450)
UIBC: 116 ug/dL

## 2017-07-17 LAB — FERRITIN: Ferritin: 104 ng/mL (ref 11–307)

## 2017-10-25 ENCOUNTER — Other Ambulatory Visit: Payer: Self-pay | Admitting: Internal Medicine

## 2017-10-25 DIAGNOSIS — Z1239 Encounter for other screening for malignant neoplasm of breast: Secondary | ICD-10-CM

## 2017-10-26 DIAGNOSIS — E871 Hypo-osmolality and hyponatremia: Secondary | ICD-10-CM | POA: Insufficient documentation

## 2017-10-30 ENCOUNTER — Ambulatory Visit: Payer: Medicare Other | Admitting: Oncology

## 2017-10-30 ENCOUNTER — Other Ambulatory Visit: Payer: Medicare Other

## 2017-11-06 ENCOUNTER — Inpatient Hospital Stay (HOSPITAL_BASED_OUTPATIENT_CLINIC_OR_DEPARTMENT_OTHER): Payer: Medicare Other | Admitting: Oncology

## 2017-11-06 ENCOUNTER — Inpatient Hospital Stay: Payer: Medicare Other | Attending: Oncology

## 2017-11-06 ENCOUNTER — Inpatient Hospital Stay: Payer: Medicare Other

## 2017-11-06 DIAGNOSIS — N189 Chronic kidney disease, unspecified: Secondary | ICD-10-CM

## 2017-11-06 DIAGNOSIS — Z8052 Family history of malignant neoplasm of bladder: Secondary | ICD-10-CM | POA: Insufficient documentation

## 2017-11-06 DIAGNOSIS — M1990 Unspecified osteoarthritis, unspecified site: Secondary | ICD-10-CM | POA: Insufficient documentation

## 2017-11-06 DIAGNOSIS — Z79899 Other long term (current) drug therapy: Secondary | ICD-10-CM | POA: Insufficient documentation

## 2017-11-06 DIAGNOSIS — F419 Anxiety disorder, unspecified: Secondary | ICD-10-CM | POA: Insufficient documentation

## 2017-11-06 DIAGNOSIS — E785 Hyperlipidemia, unspecified: Secondary | ICD-10-CM | POA: Diagnosis not present

## 2017-11-06 DIAGNOSIS — K219 Gastro-esophageal reflux disease without esophagitis: Secondary | ICD-10-CM | POA: Insufficient documentation

## 2017-11-06 DIAGNOSIS — M8580 Other specified disorders of bone density and structure, unspecified site: Secondary | ICD-10-CM | POA: Diagnosis not present

## 2017-11-06 DIAGNOSIS — Z862 Personal history of diseases of the blood and blood-forming organs and certain disorders involving the immune mechanism: Secondary | ICD-10-CM

## 2017-11-06 LAB — CBC WITH DIFFERENTIAL/PLATELET
Basophils Absolute: 0 10*3/uL (ref 0–0.1)
Basophils Relative: 1 %
Eosinophils Absolute: 0 10*3/uL (ref 0–0.7)
Eosinophils Relative: 0 %
HEMATOCRIT: 37.4 % (ref 35.0–47.0)
HEMOGLOBIN: 13 g/dL (ref 12.0–16.0)
LYMPHS ABS: 0.8 10*3/uL — AB (ref 1.0–3.6)
LYMPHS PCT: 16 %
MCH: 33.3 pg (ref 26.0–34.0)
MCHC: 34.7 g/dL (ref 32.0–36.0)
MCV: 96 fL (ref 80.0–100.0)
MONOS PCT: 10 %
Monocytes Absolute: 0.5 10*3/uL (ref 0.2–0.9)
NEUTROS PCT: 73 %
Neutro Abs: 3.8 10*3/uL (ref 1.4–6.5)
Platelets: 361 10*3/uL (ref 150–440)
RBC: 3.89 MIL/uL (ref 3.80–5.20)
RDW: 12.6 % (ref 11.5–14.5)
WBC: 5.1 10*3/uL (ref 3.6–11.0)

## 2017-11-06 LAB — IRON AND TIBC
Iron: 157 ug/dL (ref 28–170)
SATURATION RATIOS: 66 % — AB (ref 10.4–31.8)
TIBC: 238 ug/dL — ABNORMAL LOW (ref 250–450)
UIBC: 81 ug/dL

## 2017-11-06 LAB — FERRITIN: FERRITIN: 161 ng/mL (ref 11–307)

## 2017-11-06 NOTE — Progress Notes (Signed)
Patient denies any concerns today.  

## 2017-11-06 NOTE — Progress Notes (Signed)
[place]  Telephone:(336) [phone] Fax:(336) [phone]  ID: [name] OB: December 20, 1935  MR#: [id number]  VQM#:[id number]  Patient Care Team: Glendon Axe, MD as PCP - General (Internal Medicine)  CHIEF COMPLAINT: Hereditary hemochromatosis.  INTERVAL HISTORY: patient presents today for repeat laboratory work, evaluation and consideration of additional phlebotomy. She continues to feel well . She has mild anxiety and she was recently prescribed Lexapro by Dr. Candiss Norse. She has been on this medication for approximately 3-4 weeks.She has no neurological complaints. She denies any recent fevers, chills, illnesses. She has great appetite and denies weight loss. She denies chest pain or shortness of breath, nausea, vomiting or diarrhea. She has no urinary complaints.   REVIEW OF SYSTEMS:   Review of Systems  Constitutional: Negative.  Negative for malaise/fatigue.  HENT: Negative for congestion.   Eyes: Negative for blurred vision.  Respiratory: Negative.  Negative for shortness of breath.   Cardiovascular: Negative.  Negative for chest pain.  Gastrointestinal: Negative.  Negative for constipation, diarrhea, nausea and vomiting.  Genitourinary: Negative for frequency, hematuria and urgency.  Musculoskeletal: Negative for back pain, falls and myalgias.  Skin: Negative for itching and rash.  Neurological: Negative.  Negative for tingling, sensory change, weakness and headaches.  Psychiatric/Behavioral: The patient is nervous/anxious (recently started Lexapro per Dr. Candiss Norse). The patient does not have insomnia.     As per HPI. Otherwise, a complete review of systems is negative.  PAST MEDICAL HISTORY: Past Medical History:  Diagnosis Date  . Anemia   . Anxiety   . Cataract cortical, senile   . Chronic kidney disease   . Colon polyp   . Constipation   . Depression   . GERD (gastroesophageal reflux disease)   . H/O hematuria   . Hemochromatosis   .  Hyperlipidemia    since 10/2013,  Dr. Candiss Norse has cleared her from meds and reported to her her levels are [facility].   . Incontinence in female   . Osteoarthritis of both hands   . Osteopenia   . Osteoporosis   . Overactive bladder   . Stress incontinence   . Vaginal atrophy     PAST SURGICAL HISTORY: Past Surgical History:  Procedure Laterality Date  . CATARACT EXTRACTION    . COLONOSCOPY    . COLONOSCOPY WITH PROPOFOL N/A 03/28/2015   Procedure: COLONOSCOPY WITH PROPOFOL;  Surgeon: Manya Silvas, MD;  Location: [facility] ENDOSCOPY;  Service: Endoscopy;  Laterality: N/A;  . ESOPHAGOGASTRODUODENOSCOPY    . FLEXIBLE SIGMOIDOSCOPY    . TONSILLECTOMY      FAMILY HISTORY Family History  Problem Relation Age of Onset  . Bipolar disorder Father   . Cancer Mother   . Kidney disease Mother   . Bladder Cancer Mother   . Breast cancer Neg Hx        ADVANCED DIRECTIVES:    HEALTH MAINTENANCE: Social History   Tobacco Use  . Smoking status: Never Smoker  Substance Use Topics  . Alcohol use: Yes    Alcohol/week: 21.0 oz    Types: 35 Glasses of wine per week  . Drug use: No     Colonoscopy:  PAP:  Bone density:  Lipid panel:  Allergies  Allergen Reactions  . Nitrofurantoin Macrocrystal Hives    Current Outpatient Medications  Medication Sig Dispense Refill  . B Complex-C (SUPER B COMPLEX PO) Take 1 capsule by mouth daily before breakfast.    . Calcium-Vitamin D 600-200 MG-UNIT tablet Take 1 tablet by mouth 2 (  two) times daily.    . Cholecalciferol (VITAMIN D3) 5000 UNITS CAPS Take 1 capsule by mouth daily.    . ciclopirox (PENLAC) 8 % solution     . diphenhydrAMINE (BENADRYL) 50 MG capsule Take 50 mg by mouth at bedtime as needed.     . docusate sodium (COLACE) 100 MG capsule Take 300 mg by mouth at bedtime.     Marland Kitchen escitalopram (LEXAPRO) 5 MG tablet Take by mouth.    . Flaxseed, Linseed, (FLAXSEED OIL) 1000 MG CAPS Take 1 capsule by mouth 2 (two) times daily.     . Multiple  Vitamin (MULTI-VITAMINS) TABS Take 1 tablet by mouth daily.     . Omega 3 1200 MG CAPS Take 1 capsule by mouth daily.    . polycarbophil (FIBERCON) 625 MG tablet Take 1,250 mg by mouth daily.     . Probiotic Product (PROBIOTIC & ACIDOPHILUS EX ST PO) Take 1 capsule by mouth daily after supper.     . Turmeric Curcumin 500 MG CAPS Take 1 capsule by mouth daily.    . vitamin E (VITAMIN E) 400 UNIT capsule Take 400 Units by mouth daily after supper.     No current facility-administered medications for this visit.     OBJECTIVE: Vitals:   11/06/17 1047  BP: 124/74  Pulse: 77  Resp: 20  Temp: 98.1 F (36.7 C)     Body mass index is 20 kg/m.    ECOG FS:0 - Asymptomatic  General: Well-developed, well-nourished, no acute distress. Eyes: anicteric sclera. Lungs: Clear to auscultation bilaterally. Heart: Regular rate and rhythm. No rubs, murmurs, or gallops. Abdomen: Soft, nontender, nondistended. No organomegaly noted, normoactive bowel sounds. Musculoskeletal: No edema, cyanosis, or clubbing. Neuro: Alert, answering all questions appropriately. Cranial nerves grossly intact. Skin: No rashes or petechiae noted. Psych: Normal affect. Mildly anxious.   LAB RESULTS:  Lab Results  Component Value Date   PROT 7.0 06/14/2014   ALBUMIN 4.0 06/14/2014   AST 17 06/14/2014   ALT 20 06/14/2014   ALKPHOS 83 06/14/2014   BILITOT 0.4 06/14/2014    Lab Results  Component Value Date   WBC 5.1 11/06/2017   NEUTROABS 3.8 11/06/2017   HGB 13.0 11/06/2017   HCT 37.4 11/06/2017   MCV 96.0 11/06/2017   PLT 361 11/06/2017   Lab Results  Component Value Date   FERRITIN 161 11/06/2017      STUDIES: No results found.  ASSESSMENT: Hereditary hemochromatosis, by report heterozygote.  PLAN:    1. Hereditary hemochromatosis: Patient's ferritin is 161 today. Her goal is 50-100, therefore she will receive 200 ml phlebotomy today. This will be done by [place] [facility]. She is to return  in approximately 3 months for repeat laboratory work and consideration of phlebotomy and then in 6 months for labs, M.D. assessment and possible phlebotomy. She has been needing a phlebotomy approximately every 3 months. 2. Her hemoglobin remains stable at 13 today. We'll continue to monitor this well receiving phlebotomies. 3. Anxiety: Continue Lexapro as prescribed by Dr. Candiss Norse. Spoke in depth about this medication and that it affects will probably began to start working here in the next couple weeks.  Patient expressed understanding and was in agreement with this plan. She also understands that She can call clinic at any time with any questions, concerns, or complaints.    Jacquelin Hawking, NP 11/11/17 3:23 PM

## 2017-11-22 DIAGNOSIS — F5104 Psychophysiologic insomnia: Secondary | ICD-10-CM | POA: Insufficient documentation

## 2017-11-22 DIAGNOSIS — B351 Tinea unguium: Secondary | ICD-10-CM | POA: Insufficient documentation

## 2018-01-17 ENCOUNTER — Ambulatory Visit
Admission: RE | Admit: 2018-01-17 | Discharge: 2018-01-17 | Disposition: A | Discharge status: Home / Self Care | Payer: Medicare Other | Source: Ambulatory Visit | Attending: Internal Medicine | Admitting: Internal Medicine

## 2018-01-17 DIAGNOSIS — Z1231 Encounter for screening mammogram for malignant neoplasm of breast: Secondary | ICD-10-CM | POA: Insufficient documentation

## 2018-01-17 DIAGNOSIS — Z1239 Encounter for other screening for malignant neoplasm of breast: Secondary | ICD-10-CM

## 2018-02-16 NOTE — Progress Notes (Signed)
[place]  Telephone:(336) [phone] Fax:(336) [phone]  ID: [name] OB: Mar 14, 1936  MR#: [id number]  AOZ#:[id number]  Patient Care Team: Glendon Axe, MD as PCP - General (Internal Medicine)  CHIEF COMPLAINT: Hereditary hemochromatosis.  INTERVAL HISTORY: Patient returns to clinic today for repeat laboratory work, further evaluation, and consideration of additional phlebotomy.  She currently feels well and is asymptomatic. She has no neurologic complaints.  She denies any recent fevers, chills, or illnesses.  She has a good appetite and denies weight loss.  She has no chest pain or shortness of breath.  She denies any nausea, vomiting, or diarrhea.  She has no urinary complaints.  Patient feels that her baseline offers no specific complaints today.  REVIEW OF SYSTEMS:   Review of Systems  Constitutional: Negative.  Negative for fever, malaise/fatigue and weight loss.  HENT: Negative.  Negative for congestion.   Respiratory: Negative.  Negative for shortness of breath.   Cardiovascular: Negative.  Negative for chest pain and leg swelling.  Gastrointestinal: Negative.  Negative for constipation, diarrhea, nausea and vomiting.  Genitourinary: Negative.  Negative for dysuria.  Musculoskeletal: Negative.  Negative for back pain.  Skin: Negative.  Negative for rash.  Neurological: Negative.  Negative for tingling, sensory change, weakness and headaches.  Psychiatric/Behavioral: Negative.  The patient is not nervous/anxious and does not have insomnia.     As per HPI. Otherwise, a complete review of systems is negative.  PAST MEDICAL HISTORY: Past Medical History:  Diagnosis Date  . Anemia   . Anxiety   . Cataract cortical, senile   . Chronic kidney disease   . Colon polyp   . Constipation   . Depression   . GERD (gastroesophageal reflux disease)   . H/O hematuria   . Hemochromatosis   . Hyperlipidemia    since 10/2013,  Dr. Candiss Norse has cleared her from  meds and reported to her her levels are [facility].   . Incontinence in female   . Osteoarthritis of both hands   . Osteopenia   . Osteoporosis   . Overactive bladder   . Stress incontinence   . Vaginal atrophy     PAST SURGICAL HISTORY: Past Surgical History:  Procedure Laterality Date  . CATARACT EXTRACTION    . COLONOSCOPY    . COLONOSCOPY WITH PROPOFOL N/A 03/28/2015   Procedure: COLONOSCOPY WITH PROPOFOL;  Surgeon: Manya Silvas, MD;  Location: [facility] ENDOSCOPY;  Service: Endoscopy;  Laterality: N/A;  . ESOPHAGOGASTRODUODENOSCOPY    . FLEXIBLE SIGMOIDOSCOPY    . TONSILLECTOMY      FAMILY HISTORY Family History  Problem Relation Age of Onset  . Bipolar disorder Father   . Cancer Mother   . Kidney disease Mother   . Bladder Cancer Mother   . Breast cancer Neg Hx        ADVANCED DIRECTIVES:    HEALTH MAINTENANCE: Social History   Tobacco Use  . Smoking status: Never Smoker  Substance Use Topics  . Alcohol use: Yes    Alcohol/week: 21.0 oz    Types: 35 Glasses of wine per week  . Drug use: No     Colonoscopy:  PAP:  Bone density:  Lipid panel:  Allergies  Allergen Reactions  . Nitrofurantoin Macrocrystal Hives    Current Outpatient Medications  Medication Sig Dispense Refill  . B Complex-C (SUPER B COMPLEX PO) Take 1 capsule by mouth daily before breakfast.    . Calcium-Vitamin D 600-200 MG-UNIT tablet Take 1 tablet by mouth  2 (two) times daily.    . Cholecalciferol (VITAMIN D3) 5000 UNITS CAPS Take 1 capsule by mouth daily.    . Ciclopirox (CNL8 NAIL) 8 % KIT Apply topically.     . ciclopirox (PENLAC) 8 % solution     . docusate sodium (COLACE) 100 MG capsule Take 300 mg by mouth at bedtime.     Marland Kitchen escitalopram (LEXAPRO) 5 MG tablet Take 5 mg by mouth daily.     . Flaxseed, Linseed, (FLAXSEED OIL) 1000 MG CAPS Take 1 capsule by mouth 2 (two) times daily.     . Multiple Vitamin (MULTI-VITAMINS) TABS Take 1 tablet by mouth daily.     . Omega 3 1200 MG CAPS  Take 1 capsule by mouth daily.    . polycarbophil (FIBERCON) 625 MG tablet Take 1,250 mg by mouth daily.     . Probiotic Product (PROBIOTIC & ACIDOPHILUS EX ST PO) Take 1 capsule by mouth daily after supper.     . traZODone (DESYREL) 50 MG tablet Take 50 mg by mouth at bedtime.     . Turmeric Curcumin 500 MG CAPS Take 1 capsule by mouth daily.    . vitamin E (VITAMIN E) 400 UNIT capsule Take 400 Units by mouth daily after supper.    . diphenhydrAMINE (BENADRYL) 50 MG capsule Take 50 mg by mouth at bedtime as needed.     Marland Kitchen escitalopram (LEXAPRO) 5 MG tablet Take by mouth.    . fluticasone (FLONASE) 50 MCG/ACT nasal spray fluticasone propionate 50 mcg/actuation nasal spray,suspension     No current facility-administered medications for this visit.     OBJECTIVE: Vitals:   02/19/18 1405  BP: 132/79  Pulse: 87  Resp: 18  Temp: 98.4 F (36.9 C)     Body mass index is 19.06 kg/m.    ECOG FS:0 - Asymptomatic  General: Well-developed, well-nourished, no acute distress. Eyes: Pink conjunctiva, anicteric sclera. Lungs: Clear to auscultation bilaterally. Heart: Regular rate and rhythm. No rubs, murmurs, or gallops. Abdomen: Soft, nontender, nondistended. No organomegaly noted, normoactive bowel sounds. Musculoskeletal: No edema, cyanosis, or clubbing. Neuro: Alert, answering all questions appropriately. Cranial nerves grossly intact. Skin: No rashes or petechiae noted. Psych: Normal affect.  LAB RESULTS:  Lab Results  Component Value Date   PROT 7.0 06/14/2014   ALBUMIN 4.0 06/14/2014   AST 17 06/14/2014   ALT 20 06/14/2014   ALKPHOS 83 06/14/2014   BILITOT 0.4 06/14/2014    Lab Results  Component Value Date   WBC 4.5 02/19/2018   NEUTROABS 2.8 02/19/2018   HGB 12.6 02/19/2018   HCT 36.6 02/19/2018   MCV 95.8 02/19/2018   PLT 336 02/19/2018   Lab Results  Component Value Date   FERRITIN 108 02/19/2018      STUDIES: No results found.  ASSESSMENT: Hereditary  hemochromatosis, by report heterozygote.  PLAN:    1. Hereditary hemochromatosis: Patient's ferritin is 108 today. Her goal is 50-100, therefore she will receive 200 mL phlebotomy again today. She appears she only requires phlebotomy approximately every 3 months.  Return to clinic in 3 months with repeat laboratory work and additional phlebotomy and then in 6 months for further evaluation.  We will schedule patient's phlebotomy with [facility] moving forward.  Approximately 30 minutes spent in discussion of which greater than 50% was consultation.    Patient expressed understanding and was in agreement with this plan. She also understands that She can call clinic at any time with any questions, concerns, or  complaints.    Lloyd Huger, MD 02/23/18 10:02 AM

## 2018-02-19 ENCOUNTER — Inpatient Hospital Stay: Payer: Medicare Other | Attending: Oncology

## 2018-02-19 ENCOUNTER — Other Ambulatory Visit: Payer: Self-pay

## 2018-02-19 ENCOUNTER — Inpatient Hospital Stay (HOSPITAL_BASED_OUTPATIENT_CLINIC_OR_DEPARTMENT_OTHER): Payer: Medicare Other | Admitting: Oncology

## 2018-02-19 ENCOUNTER — Inpatient Hospital Stay: Payer: Medicare Other

## 2018-02-19 VITALS — BP 121/74 | HR 80 | Resp 18

## 2018-02-19 DIAGNOSIS — F418 Other specified anxiety disorders: Secondary | ICD-10-CM

## 2018-02-19 DIAGNOSIS — E785 Hyperlipidemia, unspecified: Secondary | ICD-10-CM | POA: Insufficient documentation

## 2018-02-19 DIAGNOSIS — Z862 Personal history of diseases of the blood and blood-forming organs and certain disorders involving the immune mechanism: Secondary | ICD-10-CM

## 2018-02-19 DIAGNOSIS — M1990 Unspecified osteoarthritis, unspecified site: Secondary | ICD-10-CM | POA: Diagnosis not present

## 2018-02-19 DIAGNOSIS — Z79899 Other long term (current) drug therapy: Secondary | ICD-10-CM | POA: Diagnosis not present

## 2018-02-19 DIAGNOSIS — N189 Chronic kidney disease, unspecified: Secondary | ICD-10-CM | POA: Diagnosis not present

## 2018-02-19 LAB — CBC WITH DIFFERENTIAL/PLATELET
Basophils Absolute: 0 10*3/uL (ref 0–0.1)
Basophils Relative: 1 %
EOS ABS: 0 10*3/uL (ref 0–0.7)
Eosinophils Relative: 1 %
HCT: 36.6 % (ref 35.0–47.0)
Hemoglobin: 12.6 g/dL (ref 12.0–16.0)
LYMPHS ABS: 1.2 10*3/uL (ref 1.0–3.6)
Lymphocytes Relative: 27 %
MCH: 33 pg (ref 26.0–34.0)
MCHC: 34.4 g/dL (ref 32.0–36.0)
MCV: 95.8 fL (ref 80.0–100.0)
Monocytes Absolute: 0.4 10*3/uL (ref 0.2–0.9)
Monocytes Relative: 9 %
Neutro Abs: 2.8 10*3/uL (ref 1.4–6.5)
Neutrophils Relative %: 62 %
PLATELETS: 336 10*3/uL (ref 150–440)
RBC: 3.82 MIL/uL (ref 3.80–5.20)
RDW: 12.4 % (ref 11.5–14.5)
WBC: 4.5 10*3/uL (ref 3.6–11.0)

## 2018-02-19 LAB — IRON AND TIBC
IRON: 119 ug/dL (ref 28–170)
SATURATION RATIOS: 53 % — AB (ref 10.4–31.8)
TIBC: 225 ug/dL — AB (ref 250–450)
UIBC: 106 ug/dL

## 2018-02-19 LAB — FERRITIN: FERRITIN: 108 ng/mL (ref 11–307)

## 2018-02-19 NOTE — Progress Notes (Signed)
Here for follow up. Per pt " doing well-no problems"

## 2018-02-19 NOTE — Progress Notes (Signed)
Per MD, Dr. Grayland Ormond: Proceed with phlebotomy today. Order for phlebotomy is in supportive therapy plan.

## 2018-05-26 ENCOUNTER — Inpatient Hospital Stay: Payer: Medicare Other

## 2018-05-26 ENCOUNTER — Encounter: Payer: Self-pay | Admitting: Oncology

## 2018-05-26 ENCOUNTER — Inpatient Hospital Stay: Payer: Medicare Other | Attending: Oncology

## 2018-05-27 ENCOUNTER — Other Ambulatory Visit: Payer: Self-pay | Admitting: Oncology

## 2018-08-24 NOTE — Progress Notes (Signed)
[place]  Telephone:(336) [phone] Fax:(336) [phone]  ID: Judith Kim OB: Sep 22, 1936  MR#: [id number]  WPY#:[id number]  Patient Care Team: Glendon Axe, MD as PCP - General (Internal Medicine)  CHIEF COMPLAINT: Hereditary hemochromatosis.  INTERVAL HISTORY: Patient returns to clinic today for repeat laboratory work, further evaluation, and consideration of additional phlebotomy.  She continues to feel well and remains asymptomatic. She has no neurologic complaints.  She denies any recent fevers, chills, or illnesses.  She has a good appetite and denies weight loss.  She has no chest pain or shortness of breath.  She denies any nausea, vomiting, or diarrhea.  She has no urinary complaints.  Patient feels at her baseline and offers no specific complaints today.  REVIEW OF SYSTEMS:   Review of Systems  Constitutional: Negative.  Negative for fever, malaise/fatigue and weight loss.  HENT: Negative.  Negative for congestion.   Respiratory: Negative.  Negative for shortness of breath.   Cardiovascular: Negative.  Negative for chest pain and leg swelling.  Gastrointestinal: Negative.  Negative for constipation, diarrhea, nausea and vomiting.  Genitourinary: Negative.  Negative for dysuria.  Musculoskeletal: Negative.  Negative for back pain.  Skin: Negative.  Negative for rash.  Neurological: Negative.  Negative for tingling, sensory change, weakness and headaches.  Psychiatric/Behavioral: Negative.  The patient is not nervous/anxious and does not have insomnia.     As per HPI. Otherwise, a complete review of systems is negative.  PAST MEDICAL HISTORY: Past Medical History:  Diagnosis Date  . Anemia   . Anxiety   . Cataract cortical, senile   . Chronic kidney disease   . Colon polyp   . Constipation   . Depression   . GERD (gastroesophageal reflux disease)   . H/O hematuria   . Hemochromatosis   . Hyperlipidemia    since 10/2013,  Dr. Candiss Norse has cleared  her from meds and reported to her her levels are [facility].   . Incontinence in female   . Osteoarthritis of both hands   . Osteopenia   . Osteoporosis   . Overactive bladder   . Stress incontinence   . Vaginal atrophy     PAST SURGICAL HISTORY: Past Surgical History:  Procedure Laterality Date  . CATARACT EXTRACTION    . COLONOSCOPY    . COLONOSCOPY WITH PROPOFOL N/A 03/28/2015   Procedure: COLONOSCOPY WITH PROPOFOL;  Surgeon: Manya Silvas, MD;  Location: [facility] ENDOSCOPY;  Service: Endoscopy;  Laterality: N/A;  . ESOPHAGOGASTRODUODENOSCOPY    . FLEXIBLE SIGMOIDOSCOPY    . TONSILLECTOMY      FAMILY HISTORY Family History  Problem Relation Age of Onset  . Bipolar disorder Father   . Cancer Mother   . Kidney disease Mother   . Bladder Cancer Mother   . Breast cancer Neg Hx        ADVANCED DIRECTIVES:    HEALTH MAINTENANCE: Social History   Tobacco Use  . Smoking status: Never Smoker  Substance Use Topics  . Alcohol use: Yes    Alcohol/week: 35.0 standard drinks    Types: 35 Glasses of wine per week  . Drug use: No     Colonoscopy:  PAP:  Bone density:  Lipid panel:  Allergies  Allergen Reactions  . Nitrofurantoin Macrocrystal Hives    Current Outpatient Medications  Medication Sig Dispense Refill  . B Complex-C (SUPER B COMPLEX PO) Take 1 capsule by mouth daily before breakfast.    . Calcium-Vitamin D 600-200 MG-UNIT tablet Take 1  tablet by mouth 2 (two) times daily.    . Cholecalciferol (VITAMIN D3) 5000 UNITS CAPS Take 1 capsule by mouth daily.    . Ciclopirox (CNL8 NAIL) 8 % KIT Apply topically.     . ciclopirox (PENLAC) 8 % solution     . diphenhydrAMINE (BENADRYL) 50 MG capsule Take 50 mg by mouth at bedtime as needed.     . docusate sodium (COLACE) 100 MG capsule Take 300 mg by mouth at bedtime.     Marland Kitchen escitalopram (LEXAPRO) 5 MG tablet Take by mouth.    . escitalopram (LEXAPRO) 5 MG tablet Take 5 mg by mouth daily.     . Flaxseed, Linseed,  (FLAXSEED OIL) 1000 MG CAPS Take 1 capsule by mouth 2 (two) times daily.     . fluticasone (FLONASE) 50 MCG/ACT nasal spray fluticasone propionate 50 mcg/actuation nasal spray,suspension    . Multiple Vitamin (MULTI-VITAMINS) TABS Take 1 tablet by mouth daily.     . Omega 3 1200 MG CAPS Take 1 capsule by mouth daily.    . polycarbophil (FIBERCON) 625 MG tablet Take 1,250 mg by mouth daily.     . Probiotic Product (PROBIOTIC & ACIDOPHILUS EX ST PO) Take 1 capsule by mouth daily after supper.     . traZODone (DESYREL) 50 MG tablet Take 50 mg by mouth at bedtime.     . Turmeric Curcumin 500 MG CAPS Take 1 capsule by mouth daily.    . vitamin E (VITAMIN E) 400 UNIT capsule Take 400 Units by mouth daily after supper.     No current facility-administered medications for this visit.     OBJECTIVE: Vitals:   08/25/18 1521  BP: (!) 146/89  Temp: 97.7 F (36.5 C)     Body mass index is 19.95 kg/m.    ECOG FS:0 - Asymptomatic  General: Well-developed, well-nourished, no acute distress. Eyes: Pink conjunctiva, anicteric sclera. HEENT: Normocephalic, moist mucous membranes. Lungs: Clear to auscultation bilaterally. Heart: Regular rate and rhythm. No rubs, murmurs, or gallops. Abdomen: Soft, nontender, nondistended. No organomegaly noted, normoactive bowel sounds. Musculoskeletal: No edema, cyanosis, or clubbing. Neuro: Alert, answering all questions appropriately. Cranial nerves grossly intact. Skin: No rashes or petechiae noted. Psych: Normal affect.  LAB RESULTS:  Lab Results  Component Value Date   PROT 7.0 06/14/2014   ALBUMIN 4.0 06/14/2014   AST 17 06/14/2014   ALT 20 06/14/2014   ALKPHOS 83 06/14/2014   BILITOT 0.4 06/14/2014    Lab Results  Component Value Date   WBC 4.5 02/19/2018   NEUTROABS 2.8 02/19/2018   HGB 12.6 02/19/2018   HCT 36.6 02/19/2018   MCV 95.8 02/19/2018   PLT 336 02/19/2018     STUDIES: No results found.  ASSESSMENT: Hereditary  hemochromatosis, by report heterozygote.  PLAN:    1. Hereditary hemochromatosis: Patient now receives her laboratory work at outside facility.  Her most recent ferritin was reported at 163.  Hemoglobin 12.8.  Goal ferritin is 50-100, therefore proceed with 200 mL phlebotomy.  Return to clinic in 3 months with laboratory work and phlebotomy only and then in 6 months with laboratory work and further evaluation.  Continue patient's phlebotomy with [facility].  I spent a total of 20 minutes face-to-face with the patient of which greater than 50% of the visit was spent in counseling and coordination of care as detailed above.   Patient expressed understanding and was in agreement with this plan. She also understands that She can call clinic at  any time with any questions, concerns, or complaints.    Lloyd Huger, MD 08/31/18 9:36 AM

## 2018-08-25 ENCOUNTER — Inpatient Hospital Stay (HOSPITAL_BASED_OUTPATIENT_CLINIC_OR_DEPARTMENT_OTHER): Payer: Medicare Other | Admitting: Oncology

## 2018-08-25 ENCOUNTER — Other Ambulatory Visit: Payer: Self-pay

## 2018-08-25 ENCOUNTER — Inpatient Hospital Stay: Payer: Medicare Other | Attending: Oncology

## 2018-08-25 DIAGNOSIS — M1990 Unspecified osteoarthritis, unspecified site: Secondary | ICD-10-CM | POA: Insufficient documentation

## 2018-08-25 DIAGNOSIS — K219 Gastro-esophageal reflux disease without esophagitis: Secondary | ICD-10-CM | POA: Diagnosis not present

## 2018-08-25 DIAGNOSIS — E785 Hyperlipidemia, unspecified: Secondary | ICD-10-CM

## 2018-08-25 DIAGNOSIS — N189 Chronic kidney disease, unspecified: Secondary | ICD-10-CM

## 2018-08-25 DIAGNOSIS — Z79899 Other long term (current) drug therapy: Secondary | ICD-10-CM | POA: Insufficient documentation

## 2018-08-25 NOTE — Progress Notes (Signed)
Patient is here to follow up on hereditary hemochromatosis. Patient has no concerns at this time. Patient stated that she received her flu shot this year. Patient also stated that she sees her primary care doctor in January.

## 2018-11-24 ENCOUNTER — Inpatient Hospital Stay: Payer: Medicare Other | Attending: Oncology

## 2018-11-24 ENCOUNTER — Other Ambulatory Visit: Payer: Medicare Other

## 2018-11-27 ENCOUNTER — Inpatient Hospital Stay: Payer: Medicare Other

## 2018-12-04 ENCOUNTER — Other Ambulatory Visit: Payer: Self-pay | Admitting: Internal Medicine

## 2018-12-04 DIAGNOSIS — Z1231 Encounter for screening mammogram for malignant neoplasm of breast: Secondary | ICD-10-CM

## 2019-02-02 DIAGNOSIS — M6281 Muscle weakness (generalized): Secondary | ICD-10-CM | POA: Insufficient documentation

## 2019-02-02 DIAGNOSIS — M7070 Other bursitis of hip, unspecified hip: Secondary | ICD-10-CM | POA: Insufficient documentation

## 2019-02-02 DIAGNOSIS — R269 Unspecified abnormalities of gait and mobility: Secondary | ICD-10-CM | POA: Insufficient documentation

## 2019-02-20 ENCOUNTER — Other Ambulatory Visit: Payer: Medicare Other

## 2019-02-20 ENCOUNTER — Ambulatory Visit: Payer: Medicare Other | Admitting: Oncology

## 2019-03-02 ENCOUNTER — Other Ambulatory Visit: Payer: Self-pay

## 2019-03-02 ENCOUNTER — Encounter: Payer: Self-pay | Admitting: Oncology

## 2019-03-02 ENCOUNTER — Ambulatory Visit
Admission: RE | Admit: 2019-03-02 | Discharge: 2019-03-02 | Disposition: A | Discharge status: Home / Self Care | Payer: Medicare Other | Source: Ambulatory Visit | Attending: Internal Medicine | Admitting: Internal Medicine

## 2019-03-02 DIAGNOSIS — Z1231 Encounter for screening mammogram for malignant neoplasm of breast: Secondary | ICD-10-CM | POA: Diagnosis present

## 2019-03-08 NOTE — Progress Notes (Signed)
[place]  Telephone:(336) [phone] Fax:(336) [phone]  ID: [name] OB: 08-Sep-1936  MR#: [id number]  TIW#:[id number]  Patient Care Team: Tracie Harrier, MD as PCP - General (Internal Medicine)  I connected with [name] on 03/14/19 at  2:00 PM EDT by telephone visit and verified that I am speaking with the correct person using two identifiers.   I discussed the limitations, risks, security and privacy concerns of performing an evaluation and management service by telemedicine and the availability of in-person appointments. I also discussed with the patient that there may be a patient responsible charge related to this service. The patient expressed understanding and agreed to proceed.   Other persons participating in the visit and their role in the encounter: Patient, MD  Patient's location: Home Provider's location: Clinic  CHIEF COMPLAINT: Hereditary hemochromatosis.  INTERVAL HISTORY: Patient agreed to evaluation and discussion of her laboratory work via telephone.  She continues to feel well and remains asymptomatic. She has no neurologic complaints.  She denies any recent fevers, chills, or illnesses.  She has a good appetite and denies weight loss.  She denies any chest pain, shortness of breath, cough, or hemoptysis.  She denies any nausea, vomiting, or diarrhea.  She has no urinary complaints.  Patient offers no specific complaints today.  REVIEW OF SYSTEMS:   Review of Systems  Constitutional: Negative.  Negative for fever, malaise/fatigue and weight loss.  HENT: Negative.  Negative for congestion.   Respiratory: Negative.  Negative for shortness of breath.   Cardiovascular: Negative.  Negative for chest pain and leg swelling.  Gastrointestinal: Negative.  Negative for constipation, diarrhea, nausea and vomiting.  Genitourinary: Negative.  Negative for dysuria.  Musculoskeletal: Negative.  Negative for back pain.  Skin: Negative.   Negative for rash.  Neurological: Negative.  Negative for tingling, sensory change, weakness and headaches.  Psychiatric/Behavioral: Negative.  The patient is not nervous/anxious and does not have insomnia.     As per HPI. Otherwise, a complete review of systems is negative.  PAST MEDICAL HISTORY: Past Medical History:  Diagnosis Date  . Anemia   . Anxiety   . Cataract cortical, senile   . Chronic kidney disease   . Colon polyp   . Constipation   . Depression   . GERD (gastroesophageal reflux disease)   . H/O hematuria   . Hemochromatosis   . Hyperlipidemia    since 10/2013,  Dr. Candiss Norse has cleared her from meds and reported to her her levels are [facility].   . Incontinence in female   . Osteoarthritis of both hands   . Osteopenia   . Osteoporosis   . Overactive bladder   . Stress incontinence   . Vaginal atrophy     PAST SURGICAL HISTORY: Past Surgical History:  Procedure Laterality Date  . CATARACT EXTRACTION    . COLONOSCOPY    . COLONOSCOPY WITH PROPOFOL N/A 03/28/2015   Procedure: COLONOSCOPY WITH PROPOFOL;  Surgeon: Manya Silvas, MD;  Location: [facility] ENDOSCOPY;  Service: Endoscopy;  Laterality: N/A;  . ESOPHAGOGASTRODUODENOSCOPY    . FLEXIBLE SIGMOIDOSCOPY    . TONSILLECTOMY      FAMILY HISTORY Family History  Problem Relation Age of Onset  . Bipolar disorder Father   . Cancer Mother   . Kidney disease Mother   . Bladder Cancer Mother   . Breast cancer Neg Hx        ADVANCED DIRECTIVES:    HEALTH MAINTENANCE: Social History   Tobacco Use  .  Smoking status: Never Smoker  Substance Use Topics  . Alcohol use: Yes    Alcohol/week: 35.0 standard drinks    Types: 35 Glasses of wine per week  . Drug use: No     Colonoscopy:  PAP:  Bone density:  Lipid panel:  Allergies  Allergen Reactions  . Nitrofurantoin Macrocrystal Hives    Current Outpatient Medications  Medication Sig Dispense Refill  . B Complex-C (SUPER B COMPLEX PO) Take 1 capsule by  mouth daily before breakfast.    . Calcium-Vitamin D 600-200 MG-UNIT tablet Take 1 tablet by mouth 2 (two) times daily.    . Cholecalciferol (VITAMIN D3) 5000 UNITS CAPS Take 1 capsule by mouth daily.    . diphenhydrAMINE (BENADRYL) 50 MG capsule Take 50 mg by mouth at bedtime as needed.     . docusate sodium (COLACE) 100 MG capsule Take 300 mg by mouth at bedtime.     Marland Kitchen escitalopram (LEXAPRO) 5 MG tablet Take 5 mg by mouth daily.     . Flaxseed, Linseed, (FLAXSEED OIL) 1000 MG CAPS Take 1 capsule by mouth 2 (two) times daily.     . Multiple Vitamin (MULTI-VITAMINS) TABS Take 1 tablet by mouth daily.     . Omega 3 1200 MG CAPS Take 1 capsule by mouth daily.    . polycarbophil (FIBERCON) 625 MG tablet Take 1,250 mg by mouth daily.     . Probiotic Product (PROBIOTIC & ACIDOPHILUS EX ST PO) Take 1 capsule by mouth daily after supper.     . Turmeric Curcumin 500 MG CAPS Take 1 capsule by mouth daily.    . vitamin E (VITAMIN E) 400 UNIT capsule Take 400 Units by mouth daily after supper.     No current facility-administered medications for this visit.     OBJECTIVE: There were no vitals filed for this visit.   There is no height or weight on file to calculate BMI.    ECOG FS:0 - Asymptomatic  LAB RESULTS:  Lab Results  Component Value Date   PROT 7.0 06/14/2014   ALBUMIN 4.0 06/14/2014   AST 17 06/14/2014   ALT 20 06/14/2014   ALKPHOS 83 06/14/2014   BILITOT 0.4 06/14/2014    Lab Results  Component Value Date   WBC 4.5 02/19/2018   NEUTROABS 2.8 02/19/2018   HGB 12.6 02/19/2018   HCT 36.6 02/19/2018   MCV 95.8 02/19/2018   PLT 336 02/19/2018     STUDIES: Mm 3d Screen Breast Bilateral  Result Date: 03/02/2019 CLINICAL DATA:  Screening. EXAM: DIGITAL SCREENING BILATERAL MAMMOGRAM WITH TOMO AND CAD COMPARISON:  Previous exam(s). ACR Breast Density Category c: The breast tissue is heterogeneously dense, which may obscure small masses. FINDINGS: There are no findings suspicious for  malignancy. Images were processed with CAD. IMPRESSION: No mammographic evidence of malignancy. A result letter of this screening mammogram will be mailed directly to the patient. RECOMMENDATION: Screening mammogram in one year. (Code:SM-B-01Y) BI-RADS CATEGORY  1: Negative. Electronically Signed   By: Lillia Mountain M.D.   On: 03/02/2019 15:49    ASSESSMENT: Hereditary hemochromatosis, by report heterozygote.  PLAN:    1. Hereditary hemochromatosis: Patient now receives her laboratory work at outside facility.  Her most recent ferritin from March 06, 2019 was reported at 105.  Hemoglobin was 13.7.  Her goal ferritin is 50-100, therefore she does not require 200 mL phlebotomy.  Return to clinic in 3 months with repeat laboratory work, further evaluation, and consideration of phlebotomy.  I provided 15 minutes of non face-to-face telephone visit time during this encounter, and > 50% was spent counseling as documented under my assessment & plan.    Patient expressed understanding and was in agreement with this plan. She also understands that She can call clinic at any time with any questions, concerns, or complaints.    Lloyd Huger, MD 03/14/19 6:34 AM

## 2019-03-09 ENCOUNTER — Encounter: Payer: Self-pay | Admitting: Oncology

## 2019-03-11 ENCOUNTER — Other Ambulatory Visit: Payer: Self-pay

## 2019-03-12 ENCOUNTER — Other Ambulatory Visit: Payer: Medicare Other

## 2019-03-12 ENCOUNTER — Inpatient Hospital Stay: Payer: Medicare Other | Attending: Oncology | Admitting: Oncology

## 2019-03-12 ENCOUNTER — Encounter: Payer: Self-pay | Admitting: Oncology

## 2019-03-12 ENCOUNTER — Other Ambulatory Visit: Payer: Self-pay

## 2019-03-12 NOTE — Progress Notes (Signed)
Patient stated that she had been doing well with no complaints. 

## 2019-06-23 ENCOUNTER — Ambulatory Visit: Payer: Medicare Other | Admitting: Oncology

## 2019-07-05 NOTE — Progress Notes (Signed)
[place]  Telephone:(336) [phone] Fax:(336) [phone]  ID: Judith Kim OB: [birth year]  MR#: [id number]  [id number]  Patient Care Team: Tracie Harrier, MD as PCP - General (Internal Medicine)   CHIEF COMPLAINT: Hereditary hemochromatosis.  INTERVAL HISTORY: Patient returns to clinic today for further evaluation and continuation of phlebotomy.  She continues to feel well and remains asymptomatic.  She has no neurologic complaints.  She denies any recent fevers or illnesses.  She has a good appetite and denies weight loss.  She denies any chest pain, shortness of breath, cough, or hemoptysis.  She denies any nausea, vomiting, or diarrhea.  She has no urinary complaints.  Patient feels at her baseline offers no specific complaints today.  REVIEW OF SYSTEMS:   Review of Systems  Constitutional: Negative.  Negative for fever, malaise/fatigue and weight loss.  HENT: Negative.  Negative for congestion.   Respiratory: Negative.  Negative for shortness of breath.   Cardiovascular: Negative.  Negative for chest pain and leg swelling.  Gastrointestinal: Negative.  Negative for constipation, diarrhea, nausea and vomiting.  Genitourinary: Negative.  Negative for dysuria.  Musculoskeletal: Negative.  Negative for back pain.  Skin: Negative.  Negative for rash.  Neurological: Negative.  Negative for tingling, sensory change, weakness and headaches.  Psychiatric/Behavioral: Negative.  The patient is not nervous/anxious and does not have insomnia.     As per HPI. Otherwise, a complete review of systems is negative.  PAST MEDICAL HISTORY: Past Medical History:  Diagnosis Date  . Anemia   . Anxiety   . Cataract cortical, senile   . Chronic kidney disease   . Colon polyp   . Constipation   . Depression   . GERD (gastroesophageal reflux disease)   . H/O hematuria   . Hemochromatosis   . Hyperlipidemia    since 10/2013,  Dr. Candiss Norse has cleared her from meds and  reported to her her levels are [facility].   . Incontinence in female   . Osteoarthritis of both hands   . Osteopenia   . Osteoporosis   . Overactive bladder   . Stress incontinence   . Vaginal atrophy     PAST SURGICAL HISTORY: Past Surgical History:  Procedure Laterality Date  . CATARACT EXTRACTION    . COLONOSCOPY    . COLONOSCOPY WITH PROPOFOL N/A 03/28/2015   Procedure: COLONOSCOPY WITH PROPOFOL;  Surgeon: Manya Silvas, MD;  Location: [facility] ENDOSCOPY;  Service: Endoscopy;  Laterality: N/A;  . ESOPHAGOGASTRODUODENOSCOPY    . FLEXIBLE SIGMOIDOSCOPY    . TONSILLECTOMY      FAMILY HISTORY Family History  Problem Relation Age of Onset  . Bipolar disorder Father   . Cancer Mother   . Kidney disease Mother   . Bladder Cancer Mother   . Breast cancer Neg Hx        ADVANCED DIRECTIVES:    HEALTH MAINTENANCE: Social History   Tobacco Use  . Smoking status: Never Smoker  Substance Use Topics  . Alcohol use: Yes    Alcohol/week: 35.0 standard drinks    Types: 35 Glasses of wine per week  . Drug use: No     Colonoscopy:  PAP:  Bone density:  Lipid panel:  Allergies  Allergen Reactions  . Nitrofurantoin Macrocrystal Hives    Current Outpatient Medications  Medication Sig Dispense Refill  . B Complex-C (SUPER B COMPLEX PO) Take 1 capsule by mouth daily before breakfast.    . Calcium-Vitamin D 600-200 MG-UNIT tablet Take 1 tablet by  mouth 2 (two) times daily.    . Cholecalciferol (VITAMIN D3) 5000 UNITS CAPS Take 1 capsule by mouth daily.    . diphenhydrAMINE (BENADRYL) 50 MG capsule Take 50 mg by mouth at bedtime as needed.     . docusate sodium (COLACE) 100 MG capsule Take 300 mg by mouth at bedtime.     Marland Kitchen escitalopram (LEXAPRO) 5 MG tablet Take 5 mg by mouth daily.     . Flaxseed, Linseed, (FLAXSEED OIL) 1000 MG CAPS Take 1 capsule by mouth 2 (two) times daily.     . Multiple Vitamin (MULTI-VITAMINS) TABS Take 1 tablet by mouth daily.     . Omega 3 1200 MG CAPS  Take 1 capsule by mouth daily.    . polycarbophil (FIBERCON) 625 MG tablet Take 1,250 mg by mouth daily.     . Probiotic Product (PROBIOTIC & ACIDOPHILUS EX ST PO) Take 1 capsule by mouth daily after supper.     . Turmeric Curcumin 500 MG CAPS Take 1 capsule by mouth daily.    . vitamin E (VITAMIN E) 400 UNIT capsule Take 400 Units by mouth daily after supper.     No current facility-administered medications for this visit.     OBJECTIVE: Vitals:   07/07/19 1312  BP: 122/72  Pulse: 78  Resp: 16  Temp: 99.3 F (37.4 C)     Body mass index is 20.63 kg/m.    ECOG FS:0 - Asymptomatic  General: Well-developed, well-nourished, no acute distress. Eyes: Pink conjunctiva, anicteric sclera. HEENT: Normocephalic, moist mucous membranes. Lungs: Clear to auscultation bilaterally. Heart: Regular rate and rhythm. No rubs, murmurs, or gallops. Abdomen: Soft, nontender, nondistended. No organomegaly noted, normoactive bowel sounds. Musculoskeletal: No edema, cyanosis, or clubbing. Neuro: Alert, answering all questions appropriately. Cranial nerves grossly intact. Skin: No rashes or petechiae noted. Psych: Normal affect.  LAB RESULTS:  Lab Results  Component Value Date   PROT 7.0 06/14/2014   ALBUMIN 4.0 06/14/2014   AST 17 06/14/2014   ALT 20 06/14/2014   ALKPHOS 83 06/14/2014   BILITOT 0.4 06/14/2014    Lab Results  Component Value Date   WBC 4.5 02/19/2018   NEUTROABS 2.8 02/19/2018   HGB 12.6 02/19/2018   HCT 36.6 02/19/2018   MCV 95.8 02/19/2018   PLT 336 02/19/2018     STUDIES: No results found.  ASSESSMENT: Hereditary hemochromatosis, by report heterozygote.  PLAN:    1. Hereditary hemochromatosis: Patient now receives her laboratory work at outside facility.  Her most recent laboratory work completed on July 02, 2019 revealed an elevated total iron at 198 as well as an increased iron saturation at 86%.  Ferritin was not drawn.  Her hemoglobin is 13.5.  Her goal  ferritin is 50-100.  Given her elevated iron saturations, proceed with 200 mL phlebotomy today.  Return to clinic in 3 months with repeat laboratory, further evaluation, and continuation of treatment.  I spent a total of 15 minutes face-to-face with the patient of which greater than 50% of the visit was spent in counseling and coordination of care as detailed above.   Patient expressed understanding and was in agreement with this plan. She also understands that She can call clinic at any time with any questions, concerns, or complaints.    Lloyd Huger, MD 07/08/19 7:06 AM

## 2019-07-06 ENCOUNTER — Encounter: Payer: Self-pay | Admitting: Oncology

## 2019-07-07 ENCOUNTER — Inpatient Hospital Stay: Payer: Medicare Other | Attending: Oncology | Admitting: Oncology

## 2019-07-07 ENCOUNTER — Encounter: Payer: Self-pay | Admitting: Oncology

## 2019-07-07 ENCOUNTER — Other Ambulatory Visit: Payer: Self-pay

## 2019-07-07 ENCOUNTER — Inpatient Hospital Stay: Payer: Medicare Other

## 2019-07-07 VITALS — BP 123/71 | HR 71 | Resp 18

## 2019-07-07 DIAGNOSIS — M810 Age-related osteoporosis without current pathological fracture: Secondary | ICD-10-CM | POA: Diagnosis not present

## 2019-07-07 DIAGNOSIS — N189 Chronic kidney disease, unspecified: Secondary | ICD-10-CM | POA: Insufficient documentation

## 2019-07-07 DIAGNOSIS — Z862 Personal history of diseases of the blood and blood-forming organs and certain disorders involving the immune mechanism: Secondary | ICD-10-CM

## 2019-07-07 DIAGNOSIS — Z79899 Other long term (current) drug therapy: Secondary | ICD-10-CM | POA: Diagnosis not present

## 2019-07-07 DIAGNOSIS — F418 Other specified anxiety disorders: Secondary | ICD-10-CM | POA: Diagnosis not present

## 2019-07-07 DIAGNOSIS — K219 Gastro-esophageal reflux disease without esophagitis: Secondary | ICD-10-CM | POA: Diagnosis not present

## 2019-07-07 DIAGNOSIS — E785 Hyperlipidemia, unspecified: Secondary | ICD-10-CM | POA: Insufficient documentation

## 2019-07-07 NOTE — Progress Notes (Signed)
Pt in for follow up, denies any concerns today. 

## 2019-10-06 ENCOUNTER — Encounter: Payer: Self-pay | Admitting: Oncology

## 2019-10-08 DIAGNOSIS — E871 Hypo-osmolality and hyponatremia: Secondary | ICD-10-CM | POA: Diagnosis not present

## 2019-10-08 DIAGNOSIS — R3129 Other microscopic hematuria: Secondary | ICD-10-CM | POA: Diagnosis not present

## 2019-10-08 DIAGNOSIS — I10 Essential (primary) hypertension: Secondary | ICD-10-CM | POA: Diagnosis not present

## 2019-10-10 NOTE — Progress Notes (Signed)
[place]  [phone]) [phone] Fax:(336) [phone]  ID: Judith Kim OB: 1936/03/22  MR#: [id number]  [id number]  Patient Care Team: Tracie Harrier, MD as PCP - General (Internal Medicine)  I connected with Judith Kim on 10/13/19 at  2:15 PM EST by video enabled telemedicine visit and verified that I am speaking with the correct person using two identifiers.   I discussed the limitations, risks, security and privacy concerns of performing an evaluation and management service by telemedicine and the availability of in-person appointments. I also discussed with the patient that there may be a patient responsible charge related to this service. The patient expressed understanding and agreed to proceed.   Other persons participating in the visit and their role in the encounter: Patient, MD.  Patient's location: Home. Provider's location: Clinic.  CHIEF COMPLAINT: Hereditary hemochromatosis.  INTERVAL HISTORY: Patient agreed to video assisted telemedicine visit for further evaluation and discussion of her laboratory work.  She continues to feel well and remains asymptomatic.  She has no neurologic complaints.  She denies any recent fevers or illnesses.  She has a good appetite and denies weight loss.  She denies any chest pain, shortness of breath, cough, or hemoptysis.  She denies any nausea, vomiting, or diarrhea.  She has no urinary complaints.  Patient feels at her baseline offers no specific complaints today.  REVIEW OF SYSTEMS:   Review of Systems  Constitutional: Negative.  Negative for fever, malaise/fatigue and weight loss.  HENT: Negative.  Negative for congestion.   Respiratory: Negative.  Negative for shortness of breath.   Cardiovascular: Negative.  Negative for chest pain and leg swelling.  Gastrointestinal: Negative.  Negative for constipation, diarrhea, nausea and vomiting.  Genitourinary: Negative.  Negative for dysuria.    Musculoskeletal: Negative.  Negative for back pain.  Skin: Negative.  Negative for rash.  Neurological: Negative.  Negative for tingling, sensory change, weakness and headaches.  Psychiatric/Behavioral: Negative.  The patient is not nervous/anxious and does not have insomnia.     As per HPI. Otherwise, a complete review of systems is negative.  PAST MEDICAL HISTORY: Past Medical History:  Diagnosis Date  . Anemia   . Anxiety   . Cataract cortical, senile   . Chronic kidney disease   . Colon polyp   . Constipation   . Depression   . GERD (gastroesophageal reflux disease)   . H/O hematuria   . Hemochromatosis   . Hyperlipidemia    since 10/2013,  Dr. Candiss Norse has cleared her from meds and reported to her her levels are [facility].   . Incontinence in female   . Osteoarthritis of both hands   . Osteopenia   . Osteoporosis   . Overactive bladder   . Stress incontinence   . Vaginal atrophy     PAST SURGICAL HISTORY: Past Surgical History:  Procedure Laterality Date  . CATARACT EXTRACTION    . COLONOSCOPY    . COLONOSCOPY WITH PROPOFOL N/A 03/28/2015   Procedure: COLONOSCOPY WITH PROPOFOL;  Surgeon: Manya Silvas, MD;  Location: [facility] ENDOSCOPY;  Service: Endoscopy;  Laterality: N/A;  . ESOPHAGOGASTRODUODENOSCOPY    . FLEXIBLE SIGMOIDOSCOPY    . TONSILLECTOMY      FAMILY HISTORY Family History  Problem Relation Age of Onset  . Bipolar disorder Father   . Cancer Mother   . Kidney disease Mother   . Bladder Cancer Mother   . Breast cancer Neg Hx        ADVANCED DIRECTIVES:  HEALTH MAINTENANCE: Social History   Tobacco Use  . Smoking status: Never Smoker  Substance Use Topics  . Alcohol use: Yes    Alcohol/week: 35.0 standard drinks    Types: 35 Glasses of wine per week  . Drug use: No     Colonoscopy:  PAP:  Bone density:  Lipid panel:  Allergies  Allergen Reactions  . Nitrofurantoin Macrocrystal Hives    Current Outpatient Medications  Medication  Sig Dispense Refill  . B Complex-C (SUPER B COMPLEX PO) Take 1 capsule by mouth daily before breakfast.    . Calcium-Vitamin D 600-200 MG-UNIT tablet Take 1 tablet by mouth 2 (two) times daily.    . Cholecalciferol (VITAMIN D3) 5000 UNITS CAPS Take 1 capsule by mouth daily.    . diphenhydrAMINE (BENADRYL) 50 MG capsule Take 50 mg by mouth at bedtime as needed.     . docusate sodium (COLACE) 100 MG capsule Take 300 mg by mouth at bedtime.     Marland Kitchen escitalopram (LEXAPRO) 5 MG tablet Take 5 mg by mouth daily.     . Flaxseed, Linseed, (FLAXSEED OIL) 1000 MG CAPS Take 1 capsule by mouth 2 (two) times daily.     . Multiple Vitamin (MULTI-VITAMINS) TABS Take 1 tablet by mouth daily.     . Omega 3 1200 MG CAPS Take 1 capsule by mouth daily.    . polycarbophil (FIBERCON) 625 MG tablet Take 1,250 mg by mouth daily.     . Probiotic Product (PROBIOTIC & ACIDOPHILUS EX ST PO) Take 1 capsule by mouth daily after supper.     . Turmeric Curcumin 500 MG CAPS Take 1 capsule by mouth daily.    . vitamin E (VITAMIN E) 400 UNIT capsule Take 400 Units by mouth daily after supper.     No current facility-administered medications for this visit.    OBJECTIVE: There were no vitals filed for this visit.   There is no height or weight on file to calculate BMI.    ECOG FS:0 - Asymptomatic  General: Well-developed, well-nourished, no acute distress. HEENT: Normocephalic. Neuro: Alert, answering all questions appropriately. Cranial nerves grossly intact. Psych: Normal affect.   LAB RESULTS:  Lab Results  Component Value Date   PROT 7.0 06/14/2014   ALBUMIN 4.0 06/14/2014   AST 17 06/14/2014   ALT 20 06/14/2014   ALKPHOS 83 06/14/2014   BILITOT 0.4 06/14/2014    Lab Results  Component Value Date   WBC 4.5 02/19/2018   NEUTROABS 2.8 02/19/2018   HGB 12.6 02/19/2018   HCT 36.6 02/19/2018   MCV 95.8 02/19/2018   PLT 336 02/19/2018     STUDIES: No results found.  ASSESSMENT: Hereditary  hemochromatosis, by report heterozygote.  PLAN:    1. Hereditary hemochromatosis: Patient now receives her laboratory work at outside facility.  Her most recent lab work on October 04, 2018 for 1 reported a total iron of 159 with an iron saturation of 62%.  These are both decreased from October 2020 from 198 and 86% respectively.  Hemoglobin is stable and unchanged at 13.4.  Patient does not require phlebotomy at this time.  Patient will return to clinic in person in 3 months for further evaluation and consideration of phlebotomy.    I provided 15 minutes of face-to-face video visit time during this encounter which included chart review. Greater than 50% was spent counseling as documented under my assessment & plan.   Patient expressed understanding and was in agreement with this plan. She  also understands that She can call clinic at any time with any questions, concerns, or complaints.    Lloyd Huger, MD 10/13/19 3:48 PM

## 2019-10-13 ENCOUNTER — Inpatient Hospital Stay: Payer: PPO | Attending: Oncology | Admitting: Oncology

## 2019-10-13 ENCOUNTER — Other Ambulatory Visit: Payer: Self-pay

## 2019-10-13 NOTE — Progress Notes (Signed)
Patient denies any concerns today.  

## 2019-10-20 ENCOUNTER — Encounter: Payer: Self-pay | Admitting: Oncology

## 2019-10-30 DIAGNOSIS — F330 Major depressive disorder, recurrent, mild: Secondary | ICD-10-CM | POA: Diagnosis not present

## 2019-10-30 DIAGNOSIS — K5900 Constipation, unspecified: Secondary | ICD-10-CM | POA: Diagnosis not present

## 2019-10-30 DIAGNOSIS — M19041 Primary osteoarthritis, right hand: Secondary | ICD-10-CM | POA: Diagnosis not present

## 2019-10-30 DIAGNOSIS — N393 Stress incontinence (female) (male): Secondary | ICD-10-CM | POA: Diagnosis not present

## 2019-10-30 DIAGNOSIS — M19042 Primary osteoarthritis, left hand: Secondary | ICD-10-CM | POA: Diagnosis not present

## 2019-10-30 DIAGNOSIS — Z0000 Encounter for general adult medical examination without abnormal findings: Secondary | ICD-10-CM | POA: Diagnosis not present

## 2019-10-30 DIAGNOSIS — F5104 Psychophysiologic insomnia: Secondary | ICD-10-CM | POA: Diagnosis not present

## 2019-10-30 DIAGNOSIS — F411 Generalized anxiety disorder: Secondary | ICD-10-CM | POA: Diagnosis not present

## 2019-11-25 DIAGNOSIS — L814 Other melanin hyperpigmentation: Secondary | ICD-10-CM | POA: Diagnosis not present

## 2019-11-25 DIAGNOSIS — D2262 Melanocytic nevi of left upper limb, including shoulder: Secondary | ICD-10-CM | POA: Diagnosis not present

## 2019-11-25 DIAGNOSIS — L821 Other seborrheic keratosis: Secondary | ICD-10-CM | POA: Diagnosis not present

## 2019-11-25 DIAGNOSIS — X32XXXA Exposure to sunlight, initial encounter: Secondary | ICD-10-CM | POA: Diagnosis not present

## 2019-11-25 DIAGNOSIS — D2261 Melanocytic nevi of right upper limb, including shoulder: Secondary | ICD-10-CM | POA: Diagnosis not present

## 2019-11-25 DIAGNOSIS — Z85828 Personal history of other malignant neoplasm of skin: Secondary | ICD-10-CM | POA: Diagnosis not present

## 2019-11-25 DIAGNOSIS — D2272 Melanocytic nevi of left lower limb, including hip: Secondary | ICD-10-CM | POA: Diagnosis not present

## 2019-11-25 DIAGNOSIS — L570 Actinic keratosis: Secondary | ICD-10-CM | POA: Diagnosis not present

## 2019-11-25 DIAGNOSIS — D2271 Melanocytic nevi of right lower limb, including hip: Secondary | ICD-10-CM | POA: Diagnosis not present

## 2019-12-30 DIAGNOSIS — R519 Headache, unspecified: Secondary | ICD-10-CM | POA: Diagnosis not present

## 2019-12-30 DIAGNOSIS — J019 Acute sinusitis, unspecified: Secondary | ICD-10-CM | POA: Diagnosis not present

## 2020-01-08 ENCOUNTER — Other Ambulatory Visit: Payer: Self-pay | Admitting: Internal Medicine

## 2020-01-08 DIAGNOSIS — Z1231 Encounter for screening mammogram for malignant neoplasm of breast: Secondary | ICD-10-CM

## 2020-01-08 NOTE — Progress Notes (Signed)
[place]  [phone]) [phone] [phone]) [phone]  ID: [name] OB: 1935-12-06  MR#: [id number]  [id number]  Patient Care Team: Tracie Harrier, MD as PCP - General (Internal Medicine)   CHIEF COMPLAINT: Hereditary hemochromatosis.  INTERVAL HISTORY: Patient returns to clinic today for further evaluation and consideration of phlebotomy.  She continues to feel well and remains asymptomatic. She has no neurologic complaints.  She denies any recent fevers or illnesses.  She has a good appetite and denies weight loss.  She denies any chest pain, shortness of breath, cough, or hemoptysis.  She denies any nausea, vomiting, or diarrhea.  She has no urinary complaints.  Patient offers no specific complaints today.    REVIEW OF SYSTEMS:   Review of Systems  Constitutional: Negative.  Negative for fever, malaise/fatigue and weight loss.  HENT: Negative.  Negative for congestion.   Respiratory: Negative.  Negative for shortness of breath.   Cardiovascular: Negative.  Negative for chest pain and leg swelling.  Gastrointestinal: Negative.  Negative for constipation, diarrhea, nausea and vomiting.  Genitourinary: Negative.  Negative for dysuria.  Musculoskeletal: Negative.  Negative for back pain.  Skin: Negative.  Negative for rash.  Neurological: Negative.  Negative for tingling, sensory change, weakness and headaches.  Psychiatric/Behavioral: Negative.  The patient is not nervous/anxious and does not have insomnia.     As per HPI. Otherwise, a complete review of systems is negative.  PAST MEDICAL HISTORY: Past Medical History:  Diagnosis Date  . Anemia   . Anxiety   . Cataract cortical, senile   . Chronic kidney disease   . Colon polyp   . Constipation   . Depression   . GERD (gastroesophageal reflux disease)   . H/O hematuria   . Hemochromatosis   . Hyperlipidemia    since 10/2013,  Dr. Candiss Norse has cleared her from meds and reported to her her  levels are [facility].   . Incontinence in female   . Osteoarthritis of both hands   . Osteopenia   . Osteoporosis   . Overactive bladder   . Stress incontinence   . Vaginal atrophy     PAST SURGICAL HISTORY: Past Surgical History:  Procedure Laterality Date  . CATARACT EXTRACTION    . COLONOSCOPY    . COLONOSCOPY WITH PROPOFOL N/A 03/28/2015   Procedure: COLONOSCOPY WITH PROPOFOL;  Surgeon: Manya Silvas, MD;  Location: [facility] ENDOSCOPY;  Service: Endoscopy;  Laterality: N/A;  . ESOPHAGOGASTRODUODENOSCOPY    . FLEXIBLE SIGMOIDOSCOPY    . TONSILLECTOMY      FAMILY HISTORY Family History  Problem Relation Age of Onset  . Bipolar disorder Father   . Cancer Mother   . Kidney disease Mother   . Bladder Cancer Mother   . Breast cancer Neg Hx        ADVANCED DIRECTIVES:    HEALTH MAINTENANCE: Social History   Tobacco Use  . Smoking status: Never Smoker  . Smokeless tobacco: Never Used  Substance Use Topics  . Alcohol use: Not Currently    Alcohol/week: 1.0 standard drinks    Types: 1 Glasses of wine per week  . Drug use: No     Colonoscopy:  PAP:  Bone density:  Lipid panel:  Allergies  Allergen Reactions  . Nitrofurantoin Macrocrystal Hives    Current Outpatient Medications  Medication Sig Dispense Refill  . B Complex-C (SUPER B COMPLEX PO) Take 1 capsule by mouth daily before breakfast.    . Calcium-Vitamin D 600-200 MG-UNIT tablet  Take 1 tablet by mouth 2 (two) times daily.    . Cholecalciferol (VITAMIN D3) 5000 UNITS CAPS Take 1 capsule by mouth daily.    . diphenhydrAMINE (BENADRYL) 50 MG capsule Take 50 mg by mouth at bedtime as needed.     . docusate sodium (COLACE) 100 MG capsule Take 300 mg by mouth at bedtime.     Marland Kitchen escitalopram (LEXAPRO) 5 MG tablet Take 5 mg by mouth daily.     . Flaxseed, Linseed, (FLAXSEED OIL) 1000 MG CAPS Take 1 capsule by mouth 2 (two) times daily.     . Multiple Vitamin (MULTI-VITAMINS) TABS Take 1 tablet by mouth daily.     .  Omega 3 1200 MG CAPS Take 1 capsule by mouth daily.    . polycarbophil (FIBERCON) 625 MG tablet Take 1,250 mg by mouth daily.     . Probiotic Product (PROBIOTIC & ACIDOPHILUS EX ST PO) Take 1 capsule by mouth daily after supper.     . Turmeric Curcumin 500 MG CAPS Take 1 capsule by mouth daily.    . vitamin E (VITAMIN E) 400 UNIT capsule Take 400 Units by mouth daily after supper.     No current facility-administered medications for this visit.    OBJECTIVE: Vitals:   01/12/20 1311  BP: 124/75  Pulse: 76  Resp: 18  Temp: 97.9 F (36.6 C)  SpO2: 98%     Body mass index is 20.22 kg/m.    ECOG FS:0 - Asymptomatic  General: Well-developed, well-nourished, no acute distress. Eyes: Pink conjunctiva, anicteric sclera. HEENT: Normocephalic, moist mucous membranes. Lungs: No audible wheezing or coughing. Heart: Regular rate and rhythm. Abdomen: Soft, nontender, no obvious distention. Musculoskeletal: No edema, cyanosis, or clubbing. Neuro: Alert, answering all questions appropriately. Cranial nerves grossly intact. Skin: No rashes or petechiae noted. Psych: Normal affect.   LAB RESULTS:  Lab Results  Component Value Date   PROT 7.0 06/14/2014   ALBUMIN 4.0 06/14/2014   AST 17 06/14/2014   ALT 20 06/14/2014   ALKPHOS 83 06/14/2014   BILITOT 0.4 06/14/2014    Lab Results  Component Value Date   WBC 4.5 02/19/2018   NEUTROABS 2.8 02/19/2018   HGB 12.6 02/19/2018   HCT 36.6 02/19/2018   MCV 95.8 02/19/2018   PLT 336 02/19/2018     STUDIES: No results found.  ASSESSMENT: Hereditary hemochromatosis, by report heterozygote.  PLAN:    1. Hereditary hemochromatosis: Patient now receives her laboratory work at outside facility.  Her most recent laboratory work on January 08, 2020 reported a total iron of 153 with an iron saturation of 65%.  Ferritin is 137.  These are all essentially stable and unchanged from previous.  Her hemoglobin also remained stable at 12.9.  Proceed  with 200 mL phlebotomy today.  Return to clinic in 6 months with repeat laboratory work, further evaluation, and continuation of treatment if needed.    I spent a total of 30 minutes reviewing chart data, face-to-face evaluation with the patient, counseling and coordination of care as detailed above.    Patient expressed understanding and was in agreement with this plan. She also understands that She can call clinic at any time with any questions, concerns, or complaints.    Lloyd Huger, MD 01/13/20 6:27 AM

## 2020-01-12 ENCOUNTER — Inpatient Hospital Stay: Payer: PPO | Attending: Oncology | Admitting: Oncology

## 2020-01-12 ENCOUNTER — Other Ambulatory Visit: Payer: Self-pay

## 2020-01-12 ENCOUNTER — Inpatient Hospital Stay: Payer: PPO

## 2020-01-12 ENCOUNTER — Encounter: Payer: Self-pay | Admitting: Oncology

## 2020-01-12 VITALS — BP 126/78 | HR 71

## 2020-01-12 DIAGNOSIS — F418 Other specified anxiety disorders: Secondary | ICD-10-CM | POA: Diagnosis not present

## 2020-01-12 DIAGNOSIS — N189 Chronic kidney disease, unspecified: Secondary | ICD-10-CM | POA: Diagnosis not present

## 2020-01-12 DIAGNOSIS — E785 Hyperlipidemia, unspecified: Secondary | ICD-10-CM | POA: Diagnosis not present

## 2020-01-12 DIAGNOSIS — K219 Gastro-esophageal reflux disease without esophagitis: Secondary | ICD-10-CM | POA: Insufficient documentation

## 2020-01-12 DIAGNOSIS — Z79899 Other long term (current) drug therapy: Secondary | ICD-10-CM | POA: Insufficient documentation

## 2020-01-12 DIAGNOSIS — M1990 Unspecified osteoarthritis, unspecified site: Secondary | ICD-10-CM | POA: Insufficient documentation

## 2020-01-12 DIAGNOSIS — Z862 Personal history of diseases of the blood and blood-forming organs and certain disorders involving the immune mechanism: Secondary | ICD-10-CM

## 2020-03-09 ENCOUNTER — Ambulatory Visit
Admission: RE | Admit: 2020-03-09 | Discharge: 2020-03-09 | Disposition: A | Discharge status: Home / Self Care | Payer: PPO | Source: Ambulatory Visit | Attending: Internal Medicine | Admitting: Internal Medicine

## 2020-03-09 DIAGNOSIS — Z1231 Encounter for screening mammogram for malignant neoplasm of breast: Secondary | ICD-10-CM | POA: Insufficient documentation

## 2020-06-27 DIAGNOSIS — H35372 Puckering of macula, left eye: Secondary | ICD-10-CM | POA: Diagnosis not present

## 2020-07-05 ENCOUNTER — Encounter: Payer: Self-pay | Admitting: Oncology

## 2020-07-07 NOTE — Progress Notes (Signed)
[place]  [phone] [phone]  ID: Judith Kim OB: [birth year]  MR#: [id number]  EXH#:[id number]  Patient Care Team: Tracie Harrier, MD as PCP - General (Internal Medicine)   CHIEF COMPLAINT: Hereditary hemochromatosis.  INTERVAL HISTORY: Patient returns to clinic today for routine 17-month evaluation and continuation of phlebotomy.  She continues to feel well and remains asymptomatic. She has no neurologic complaints.  She denies any recent fevers or illnesses.  She has a good appetite and denies weight loss.  She denies any chest pain, shortness of breath, cough, or hemoptysis.  She denies any nausea, vomiting, or diarrhea.  She has no urinary complaints.  Patient offers no specific complaints today.  REVIEW OF SYSTEMS:   Review of Systems  Constitutional: Negative.  Negative for fever, malaise/fatigue and weight loss.  HENT: Negative.  Negative for congestion.   Respiratory: Negative.  Negative for shortness of breath.   Cardiovascular: Negative.  Negative for chest pain and leg swelling.  Gastrointestinal: Negative.  Negative for constipation, diarrhea, nausea and vomiting.  Genitourinary: Negative.  Negative for dysuria.  Musculoskeletal: Negative.  Negative for back pain.  Skin: Negative.  Negative for rash.  Neurological: Negative.  Negative for tingling, sensory change, weakness and headaches.  Psychiatric/Behavioral: Negative.  The patient is not nervous/anxious and does not have insomnia.     As per HPI. Otherwise, a complete review of systems is negative.  PAST MEDICAL HISTORY: Past Medical History:  Diagnosis Date  . Anemia   . Anxiety   . Cataract cortical, senile   . Chronic kidney disease   . Colon polyp   . Constipation   . Depression   . GERD (gastroesophageal reflux disease)   . H/O hematuria   . Hemochromatosis   . Hyperlipidemia    since 10/2013,  Dr. Candiss Norse has cleared her from meds and reported to her  her levels are [facility].   . Incontinence in female   . Osteoarthritis of both hands   . Osteopenia   . Osteoporosis   . Overactive bladder   . Stress incontinence   . Vaginal atrophy     PAST SURGICAL HISTORY: Past Surgical History:  Procedure Laterality Date  . CATARACT EXTRACTION    . COLONOSCOPY    . COLONOSCOPY WITH PROPOFOL N/A 03/28/2015   Procedure: COLONOSCOPY WITH PROPOFOL;  Surgeon: Manya Silvas, MD;  Location: [facility] ENDOSCOPY;  Service: Endoscopy;  Laterality: N/A;  . ESOPHAGOGASTRODUODENOSCOPY    . FLEXIBLE SIGMOIDOSCOPY    . TONSILLECTOMY      FAMILY HISTORY Family History  Problem Relation Age of Onset  . Bipolar disorder Father   . Cancer Mother   . Kidney disease Mother   . Bladder Cancer Mother   . Breast cancer Neg Hx        ADVANCED DIRECTIVES:    HEALTH MAINTENANCE: Social History   Tobacco Use  . Smoking status: Never Smoker  . Smokeless tobacco: Never Used  Vaping Use  . Vaping Use: Never used  Substance Use Topics  . Alcohol use: Not Currently    Alcohol/week: 1.0 standard drink    Types: 1 Glasses of wine per week  . Drug use: No     Colonoscopy:  PAP:  Bone density:  Lipid panel:  Allergies  Allergen Reactions  . Nitrofurantoin Macrocrystal Hives    Current Outpatient Medications  Medication Sig Dispense Refill  . Calcium-Vitamin D 600-200 MG-UNIT tablet Take 1 tablet by mouth 2 (two) times daily.    Marland Kitchen  Cholecalciferol (VITAMIN D3) 5000 UNITS CAPS Take 1 capsule by mouth daily.    Marland Kitchen docusate sodium (COLACE) 100 MG capsule Take 300 mg by mouth at bedtime.     Marland Kitchen escitalopram (LEXAPRO) 5 MG tablet Take 5 mg by mouth daily.     . Flaxseed, Linseed, (FLAXSEED OIL) 1000 MG CAPS Take 1 capsule by mouth 2 (two) times daily.     . Multiple Vitamin (MULTI-VITAMINS) TABS Take 1 tablet by mouth daily.     . Omega 3 1200 MG CAPS Take 1 capsule by mouth daily.    . polycarbophil (FIBERCON) 625 MG tablet Take 1,250 mg by mouth daily.     .  Probiotic Product (PROBIOTIC & ACIDOPHILUS EX ST PO) Take 1 capsule by mouth daily after supper.     . Turmeric Curcumin 500 MG CAPS Take 1 capsule by mouth daily.    . vitamin E (VITAMIN E) 400 UNIT capsule Take 400 Units by mouth daily after supper.     No current facility-administered medications for this visit.    OBJECTIVE: Vitals:   07/14/20 1425  BP: 123/73  Pulse: 74  Resp: 16  Temp: 98.5 F (36.9 C)     Body mass index is 20.13 kg/m.    ECOG FS:0 - Asymptomatic  General: Well-developed, well-nourished, no acute distress. Eyes: Pink conjunctiva, anicteric sclera. HEENT: Normocephalic, moist mucous membranes. Lungs: No audible wheezing or coughing. Heart: Regular rate and rhythm. Abdomen: Soft, nontender, no obvious distention. Musculoskeletal: No edema, cyanosis, or clubbing. Neuro: Alert, answering all questions appropriately. Cranial nerves grossly intact. Skin: No rashes or petechiae noted. Psych: Normal affect.   LAB RESULTS:  Lab Results  Component Value Date   PROT 7.0 06/14/2014   ALBUMIN 4.0 06/14/2014   AST 17 06/14/2014   ALT 20 06/14/2014   ALKPHOS 83 06/14/2014   BILITOT 0.4 06/14/2014    Lab Results  Component Value Date   WBC 4.5 02/19/2018   NEUTROABS 2.8 02/19/2018   HGB 12.6 02/19/2018   HCT 36.6 02/19/2018   MCV 95.8 02/19/2018   PLT 336 02/19/2018     STUDIES: No results found.  ASSESSMENT: Hereditary hemochromatosis, by report heterozygote.  PLAN:    1. Hereditary hemochromatosis: Patient now receives her laboratory work at outside facility.  Her most recent laboratory work from July 05, 2020 reported a total iron of 135 and an iron saturation of 56%.  Ferritin is 142.  These results are essentially stable and unchanged from previous.  Proceed with 200 mL phlebotomy today.  Return to clinic in 6 months with repeat laboratory work, further evaluation, and continuation of phlebotomy.    I spent a total of 20 minutes reviewing  chart data, face-to-face evaluation with the patient, counseling and coordination of care as detailed above.   Patient expressed understanding and was in agreement with this plan. She also understands that She can call clinic at any time with any questions, concerns, or complaints.    Lloyd Huger, MD 07/15/20 7:01 AM

## 2020-07-14 ENCOUNTER — Inpatient Hospital Stay: Payer: PPO

## 2020-07-14 ENCOUNTER — Encounter: Payer: Self-pay | Admitting: Oncology

## 2020-07-14 ENCOUNTER — Other Ambulatory Visit: Payer: Self-pay

## 2020-07-14 ENCOUNTER — Inpatient Hospital Stay: Payer: PPO | Attending: Oncology | Admitting: Oncology

## 2020-07-14 VITALS — BP 118/68 | HR 62

## 2020-07-14 DIAGNOSIS — Z79899 Other long term (current) drug therapy: Secondary | ICD-10-CM | POA: Diagnosis not present

## 2020-07-14 DIAGNOSIS — E785 Hyperlipidemia, unspecified: Secondary | ICD-10-CM | POA: Diagnosis not present

## 2020-07-14 DIAGNOSIS — N189 Chronic kidney disease, unspecified: Secondary | ICD-10-CM | POA: Diagnosis not present

## 2020-07-14 DIAGNOSIS — F419 Anxiety disorder, unspecified: Secondary | ICD-10-CM | POA: Diagnosis not present

## 2020-07-14 DIAGNOSIS — K219 Gastro-esophageal reflux disease without esophagitis: Secondary | ICD-10-CM | POA: Diagnosis not present

## 2020-07-14 DIAGNOSIS — Z862 Personal history of diseases of the blood and blood-forming organs and certain disorders involving the immune mechanism: Secondary | ICD-10-CM

## 2020-07-14 NOTE — Progress Notes (Signed)
1520: pt tolerated procedure well. No s/s of distress noted. Pt stable at time of discharge.

## 2020-07-14 NOTE — Progress Notes (Signed)
Pt in for 6 month follow up, denies any concerns today. 

## 2020-07-28 DIAGNOSIS — D2272 Melanocytic nevi of left lower limb, including hip: Secondary | ICD-10-CM | POA: Diagnosis not present

## 2020-07-28 DIAGNOSIS — D2262 Melanocytic nevi of left upper limb, including shoulder: Secondary | ICD-10-CM | POA: Diagnosis not present

## 2020-07-28 DIAGNOSIS — Z85828 Personal history of other malignant neoplasm of skin: Secondary | ICD-10-CM | POA: Diagnosis not present

## 2020-07-28 DIAGNOSIS — D225 Melanocytic nevi of trunk: Secondary | ICD-10-CM | POA: Diagnosis not present

## 2020-07-28 DIAGNOSIS — D485 Neoplasm of uncertain behavior of skin: Secondary | ICD-10-CM | POA: Diagnosis not present

## 2020-07-28 DIAGNOSIS — L821 Other seborrheic keratosis: Secondary | ICD-10-CM | POA: Diagnosis not present

## 2020-07-28 DIAGNOSIS — C44622 Squamous cell carcinoma of skin of right upper limb, including shoulder: Secondary | ICD-10-CM | POA: Diagnosis not present

## 2020-07-28 DIAGNOSIS — D2261 Melanocytic nevi of right upper limb, including shoulder: Secondary | ICD-10-CM | POA: Diagnosis not present

## 2020-08-31 DIAGNOSIS — C44622 Squamous cell carcinoma of skin of right upper limb, including shoulder: Secondary | ICD-10-CM | POA: Diagnosis not present

## 2020-10-13 DIAGNOSIS — M19042 Primary osteoarthritis, left hand: Secondary | ICD-10-CM | POA: Diagnosis not present

## 2020-10-13 DIAGNOSIS — N393 Stress incontinence (female) (male): Secondary | ICD-10-CM | POA: Diagnosis not present

## 2020-10-13 DIAGNOSIS — F411 Generalized anxiety disorder: Secondary | ICD-10-CM | POA: Diagnosis not present

## 2020-10-13 DIAGNOSIS — Z7689 Persons encountering health services in other specified circumstances: Secondary | ICD-10-CM | POA: Diagnosis not present

## 2020-10-13 DIAGNOSIS — K5900 Constipation, unspecified: Secondary | ICD-10-CM | POA: Diagnosis not present

## 2020-10-13 DIAGNOSIS — F5104 Psychophysiologic insomnia: Secondary | ICD-10-CM | POA: Diagnosis not present

## 2020-10-13 DIAGNOSIS — M19041 Primary osteoarthritis, right hand: Secondary | ICD-10-CM | POA: Diagnosis not present

## 2020-10-31 DIAGNOSIS — N3946 Mixed incontinence: Secondary | ICD-10-CM | POA: Diagnosis not present

## 2020-10-31 DIAGNOSIS — F5104 Psychophysiologic insomnia: Secondary | ICD-10-CM | POA: Diagnosis not present

## 2020-10-31 DIAGNOSIS — Z0000 Encounter for general adult medical examination without abnormal findings: Secondary | ICD-10-CM | POA: Diagnosis not present

## 2020-10-31 DIAGNOSIS — E871 Hypo-osmolality and hyponatremia: Secondary | ICD-10-CM | POA: Diagnosis not present

## 2020-10-31 DIAGNOSIS — F3340 Major depressive disorder, recurrent, in remission, unspecified: Secondary | ICD-10-CM | POA: Diagnosis not present

## 2020-10-31 DIAGNOSIS — K5909 Other constipation: Secondary | ICD-10-CM | POA: Diagnosis not present

## 2021-01-03 DIAGNOSIS — D2262 Melanocytic nevi of left upper limb, including shoulder: Secondary | ICD-10-CM | POA: Diagnosis not present

## 2021-01-03 DIAGNOSIS — D2271 Melanocytic nevi of right lower limb, including hip: Secondary | ICD-10-CM | POA: Diagnosis not present

## 2021-01-03 DIAGNOSIS — Z85828 Personal history of other malignant neoplasm of skin: Secondary | ICD-10-CM | POA: Diagnosis not present

## 2021-01-03 DIAGNOSIS — D485 Neoplasm of uncertain behavior of skin: Secondary | ICD-10-CM | POA: Diagnosis not present

## 2021-01-03 DIAGNOSIS — L570 Actinic keratosis: Secondary | ICD-10-CM | POA: Diagnosis not present

## 2021-01-03 DIAGNOSIS — D2261 Melanocytic nevi of right upper limb, including shoulder: Secondary | ICD-10-CM | POA: Diagnosis not present

## 2021-01-03 DIAGNOSIS — X32XXXA Exposure to sunlight, initial encounter: Secondary | ICD-10-CM | POA: Diagnosis not present

## 2021-01-03 DIAGNOSIS — D225 Melanocytic nevi of trunk: Secondary | ICD-10-CM | POA: Diagnosis not present

## 2021-01-04 ENCOUNTER — Other Ambulatory Visit: Payer: Self-pay | Admitting: Internal Medicine

## 2021-01-04 DIAGNOSIS — Z1231 Encounter for screening mammogram for malignant neoplasm of breast: Secondary | ICD-10-CM

## 2021-01-09 DIAGNOSIS — L570 Actinic keratosis: Secondary | ICD-10-CM | POA: Diagnosis not present

## 2021-01-12 DIAGNOSIS — Z862 Personal history of diseases of the blood and blood-forming organs and certain disorders involving the immune mechanism: Secondary | ICD-10-CM | POA: Diagnosis not present

## 2021-01-16 ENCOUNTER — Ambulatory Visit: Payer: PPO | Admitting: Oncology

## 2021-01-16 ENCOUNTER — Encounter: Payer: Self-pay | Admitting: Oncology

## 2021-01-22 NOTE — Progress Notes (Signed)
[place]  [phone]) [phone] [phone]) [phone]  ID: Judith Kim OB: 1936/09/07  MR#: [id number]  OHY#:[id number]  Patient Care Team: Tracie Harrier, MD as PCP - General (Internal Medicine)   CHIEF COMPLAINT: Hereditary hemochromatosis.  INTERVAL HISTORY: Patient returns to clinic today for routine 35-month evaluation and continuation of phlebotomy.  She continues to feel well and remains asymptomatic.  She has no neurologic complaints.  She denies any recent fevers or illnesses.  She has a good appetite and denies weight loss.  She denies any chest pain, shortness of breath, cough, or hemoptysis.  She denies any nausea, vomiting, or diarrhea.  She has no urinary complaints.  Patient feels at her baseline offers no specific complaints today.  REVIEW OF SYSTEMS:   Review of Systems  Constitutional: Negative.  Negative for fever, malaise/fatigue and weight loss.  HENT: Negative.  Negative for congestion.   Respiratory: Negative.  Negative for shortness of breath.   Cardiovascular: Negative.  Negative for chest pain and leg swelling.  Gastrointestinal: Negative.  Negative for constipation, diarrhea, nausea and vomiting.  Genitourinary: Negative.  Negative for dysuria.  Musculoskeletal: Negative.  Negative for back pain.  Skin: Negative.  Negative for rash.  Neurological: Negative.  Negative for tingling, sensory change, weakness and headaches.  Psychiatric/Behavioral: Negative.  The patient is not nervous/anxious and does not have insomnia.     As per HPI. Otherwise, a complete review of systems is negative.  PAST MEDICAL HISTORY: Past Medical History:  Diagnosis Date  . Anemia   . Anxiety   . Cataract cortical, senile   . Chronic kidney disease   . Colon polyp   . Constipation   . Depression   . GERD (gastroesophageal reflux disease)   . H/O hematuria   . Hemochromatosis   . Hyperlipidemia    since 10/2013,  Dr. Candiss Norse has cleared her from  meds and reported to her her levels are [facility].   . Incontinence in female   . Osteoarthritis of both hands   . Osteopenia   . Osteoporosis   . Overactive bladder   . Stress incontinence   . Vaginal atrophy     PAST SURGICAL HISTORY: Past Surgical History:  Procedure Laterality Date  . CATARACT EXTRACTION    . COLONOSCOPY    . COLONOSCOPY WITH PROPOFOL N/A 03/28/2015   Procedure: COLONOSCOPY WITH PROPOFOL;  Surgeon: Manya Silvas, MD;  Location: [facility] ENDOSCOPY;  Service: Endoscopy;  Laterality: N/A;  . ESOPHAGOGASTRODUODENOSCOPY    . FLEXIBLE SIGMOIDOSCOPY    . TONSILLECTOMY      FAMILY HISTORY Family History  Problem Relation Age of Onset  . Bipolar disorder Father   . Cancer Mother   . Kidney disease Mother   . Bladder Cancer Mother   . Breast cancer Neg Hx        ADVANCED DIRECTIVES:    HEALTH MAINTENANCE: Social History   Tobacco Use  . Smoking status: Never Smoker  . Smokeless tobacco: Never Used  Vaping Use  . Vaping Use: Never used  Substance Use Topics  . Alcohol use: Not Currently    Alcohol/week: 1.0 standard drink    Types: 1 Glasses of wine per week  . Drug use: No     Colonoscopy:  PAP:  Bone density:  Lipid panel:  Allergies  Allergen Reactions  . Nitrofurantoin Macrocrystal Hives    Current Outpatient Medications  Medication Sig Dispense Refill  . Calcium-Vitamin D 600-200 MG-UNIT tablet Take 1 tablet by mouth  2 (two) times daily.    . Cholecalciferol (VITAMIN D3) 5000 UNITS CAPS Take 1 capsule by mouth daily.    Marland Kitchen escitalopram (LEXAPRO) 5 MG tablet Take 5 mg by mouth daily.     . Flaxseed, Linseed, (FLAXSEED OIL) 1000 MG CAPS Take 1 capsule by mouth 2 (two) times daily.     . Multiple Vitamin (MULTI-VITAMINS) TABS Take 1 tablet by mouth daily.     . Omega 3 1200 MG CAPS Take 1 capsule by mouth daily.    . polycarbophil (FIBERCON) 625 MG tablet Take 1,250 mg by mouth daily.     . Probiotic Product (PROBIOTIC & ACIDOPHILUS EX ST PO)  Take 1 capsule by mouth daily after supper.     . Turmeric Curcumin 500 MG CAPS Take 1 capsule by mouth daily.    . vitamin E 180 MG (400 UNITS) capsule Take 400 Units by mouth daily after supper.    . docusate sodium (COLACE) 100 MG capsule Take 300 mg by mouth at bedtime.      No current facility-administered medications for this visit.    OBJECTIVE: Vitals:   01/23/21 1425  BP: 110/68  Pulse: 77  Resp: 17  Temp: 97.6 F (36.4 C)  SpO2: 97%     Body mass index is 20.06 kg/m.    ECOG FS:0 - Asymptomatic  General: Well-developed, well-nourished, no acute distress. Eyes: Pink conjunctiva, anicteric sclera. HEENT: Normocephalic, moist mucous membranes. Lungs: No audible wheezing or coughing. Heart: Regular rate and rhythm. Abdomen: Soft, nontender, no obvious distention. Musculoskeletal: No edema, cyanosis, or clubbing. Neuro: Alert, answering all questions appropriately. Cranial nerves grossly intact. Skin: No rashes or petechiae noted. Psych: Normal affect.   LAB RESULTS:  Lab Results  Component Value Date   PROT 7.0 06/14/2014   ALBUMIN 4.0 06/14/2014   AST 17 06/14/2014   ALT 20 06/14/2014   ALKPHOS 83 06/14/2014   BILITOT 0.4 06/14/2014    Lab Results  Component Value Date   WBC 4.5 02/19/2018   NEUTROABS 2.8 02/19/2018   HGB 12.6 02/19/2018   HCT 36.6 02/19/2018   MCV 95.8 02/19/2018   PLT 336 02/19/2018     STUDIES: No results found.  ASSESSMENT: Hereditary hemochromatosis, by report heterozygote.  PLAN:    1. Hereditary hemochromatosis: Patient now receives her laboratory work at outside facility.  Her most recent laboratory work from January 12, 2021 revealed a total iron of 200 and iron saturation of 90%.  These are both significantly elevated from previous.  Ferritin remains within normal limits at 133.  Proceed with 200 mL of phlebotomy today.  We will add in extra phlebotomy treatments in 2 and 4 months.  Patient will then return to clinic in 6  months with repeat laboratory work, further evaluation, and continuation of treatment if needed.   I spent a total of 30 minutes reviewing chart data, face-to-face evaluation with the patient, counseling and coordination of care as detailed above.   Patient expressed understanding and was in agreement with this plan. She also understands that She can call clinic at any time with any questions, concerns, or complaints.    Lloyd Huger, MD 01/24/21 6:37 AM

## 2021-01-23 ENCOUNTER — Other Ambulatory Visit: Payer: Self-pay

## 2021-01-23 ENCOUNTER — Inpatient Hospital Stay: Payer: PPO | Attending: Oncology | Admitting: Oncology

## 2021-01-23 ENCOUNTER — Inpatient Hospital Stay: Payer: PPO

## 2021-01-23 ENCOUNTER — Encounter: Payer: Self-pay | Admitting: Oncology

## 2021-01-23 VITALS — BP 130/67 | HR 63 | Resp 18

## 2021-01-23 DIAGNOSIS — Z79899 Other long term (current) drug therapy: Secondary | ICD-10-CM | POA: Insufficient documentation

## 2021-01-23 DIAGNOSIS — Z862 Personal history of diseases of the blood and blood-forming organs and certain disorders involving the immune mechanism: Secondary | ICD-10-CM

## 2021-01-23 NOTE — Patient Instructions (Addendum)
[facility] ONCOLOGY    Discharge Instructions: Thank you for choosing [facility] to provide your oncology and hematology care.  If you have a lab appointment with the [facility], please go directly to the [facility] and check in at the registration area.  We strive to give you quality time with your provider. You may need to reschedule your appointment if you arrive late (15 or more minutes).  Arriving late affects you and other patients whose appointments are after yours.  Also, if you miss three or more appointments without notifying the office, you may be dismissed from the clinic at the provider's discretion.      Today you received the following procedure: Therapeutic Phlebotomy.  Should you have questions after your visit or need to cancel or reschedule your appointment, please contact [facility]  [phone] and follow the prompts.  Office hours are 8:00 a.m. to 4:30 p.m. Monday - Friday. Please note that voicemails left after 4:00 p.m. may not be returned until the following business day.  We are closed weekends and major holidays. You have access to a nurse at all times for urgent questions. Please call the main number to the clinic [phone] and follow the prompts.  For any non-urgent questions, you may also contact your provider using MyChart. We now offer e-Visits for anyone 33 and older to request care online for non-urgent symptoms. For details visit [email or web address].   Also download the MyChart app! Go to the app store, search "MyChart", open the app, select [facility], and log in with your MyChart username and password.  Due to Covid, a mask is required upon entering the hospital/clinic. If you do not have a mask, one will be given to you upon arrival. For doctor visits, patients may have 1 support person aged 88 or older with them. For treatment visits, patients cannot have anyone with  them due to current Covid guidelines and our immunocompromised population.   Therapeutic Phlebotomy Discharge Instructions  - Increase your fluid intake over the next 4 hours  - No smoking for 30 minutes  - Avoid using the affected arm (the one you had the blood drawn from) for heavy lifting or other activities.  - You may resume all normal activities after 30 minutes.  You are to notify the office if you experience:   - Persistent dizziness and/or lightheadedness -Uncontrolled or excessive bleeding at the site.   Therapeutic Phlebotomy, Care After This sheet gives you information about how to care for yourself after your procedure. Your health care provider may also give you more specific instructions. If you have problems or questions, contact your health care provider. What can I expect after the procedure? After the procedure, it is common to have:  Light-headedness or dizziness. You may feel faint.  Nausea.  Tiredness (fatigue). Follow these instructions at home: Eating and drinking  Be sure to eat well-balanced meals for the next 24 hours.  Drink enough fluid to keep your urine pale yellow.  Avoid drinking alcohol on the day that you had the procedure. Activity  Return to your normal activities as told by your health care provider. Most people can go back to their normal activities right away.  Avoid activities that take a lot of effort for about 5 hours after the procedure. Athletes should avoid strenuous exercise for at least 12 hours.  Avoid heavy lifting or pulling for about 5 hours after the procedure. Do not  lift anything that is heavier than 10 lb (4.5 kg).  Change positions slowly for the remainder of the day. This will help to prevent light-headedness or fainting.  If you feel light-headed, lie down until the feeling goes away.   Needle insertion site care  Keep your bandage (dressing) dry. You can remove the bandage after about 5 hours or as told by  your health care provider.  If you have bleeding from the needle insertion site, raise (elevate) your arm and press firmly on the site until the bleeding stops.  If you have bruising at the site, apply ice to the area: ? Remove the dressing. ? Put ice in a plastic bag. ? Place a towel between your skin and the bag. ? Leave the ice on for 20 minutes, 2-3 times a day for the first 24 hours.  If the swelling does not go away after 24 hours, apply a warm, moist cloth (warm compress) to the area for 20 minutes, 2-3 times a day.   General instructions  Do not use any products that contain nicotine or tobacco, such as cigarettes and e-cigarettes, for at least 30 minutes after the procedure.  Keep all follow-up visits as told by your health care provider. This is important. You may need to continue having regular therapeutic phlebotomy treatments as directed. Contact a health care provider if you:  Have redness, swelling, or pain at the needle insertion site.  Have fluid or blood coming from the needle insertion site.  Have pus or a bad smell coming from the needle insertion site.  Notice that the needle insertion site feels warm to the touch.  Feel light-headed, dizzy, or nauseous, and the feeling does not go away.  Have new bruising at the needle insertion site.  Feel weaker than normal.  Have a fever or chills. Get help right away if:  You faint.  You have chest pain.  You have trouble breathing.  You have severe nausea or vomiting. Summary  After the procedure, it is common to have some light-headedness, dizziness, nausea, or tiredness (fatigue).  Be sure to eat well-balanced meals for the next 24 hours. Drink enough fluid to keep your urine pale yellow.  Return to your normal activities as told by your health care provider.  Keep all follow-up visits as told by your health care provider. You may need to continue having regular therapeutic phlebotomy treatments as  directed. This information is not intended to replace advice given to you by your health care provider. Make sure you discuss any questions you have with your health care provider. Document Revised: 10/04/2017 Document Reviewed: 10/03/2017 Elsevier Patient Education  2021 [employer].

## 2021-03-01 ENCOUNTER — Encounter: Payer: Self-pay | Admitting: Oncology

## 2021-03-10 ENCOUNTER — Other Ambulatory Visit: Payer: Self-pay

## 2021-03-10 ENCOUNTER — Ambulatory Visit
Admission: RE | Admit: 2021-03-10 | Discharge: 2021-03-10 | Disposition: A | Discharge status: Home / Self Care | Payer: PPO | Source: Ambulatory Visit | Attending: Internal Medicine | Admitting: Internal Medicine

## 2021-03-10 DIAGNOSIS — Z1231 Encounter for screening mammogram for malignant neoplasm of breast: Secondary | ICD-10-CM | POA: Diagnosis not present

## 2021-03-13 ENCOUNTER — Encounter: Payer: Self-pay | Admitting: Oncology

## 2021-03-20 ENCOUNTER — Telehealth: Payer: Self-pay | Admitting: *Deleted

## 2021-03-20 NOTE — Telephone Encounter (Signed)
Patient has upcoming appointment for Phlebotomy for which [facility] usually draws her lab before she comes. They do not have orders for lab draw. Please fax lab orders over to [place], [place]

## 2021-03-22 DIAGNOSIS — Z862 Personal history of diseases of the blood and blood-forming organs and certain disorders involving the immune mechanism: Secondary | ICD-10-CM | POA: Diagnosis not present

## 2021-03-23 ENCOUNTER — Encounter: Payer: Self-pay | Admitting: Oncology

## 2021-03-27 ENCOUNTER — Inpatient Hospital Stay: Payer: PPO | Attending: Oncology

## 2021-03-27 ENCOUNTER — Other Ambulatory Visit: Payer: Self-pay

## 2021-03-27 VITALS — BP 147/82 | HR 66 | Temp 98.5°F | Resp 20

## 2021-03-27 DIAGNOSIS — Z862 Personal history of diseases of the blood and blood-forming organs and certain disorders involving the immune mechanism: Secondary | ICD-10-CM

## 2021-03-27 NOTE — Patient Instructions (Signed)

## 2021-04-07 DIAGNOSIS — F330 Major depressive disorder, recurrent, mild: Secondary | ICD-10-CM | POA: Diagnosis not present

## 2021-04-07 DIAGNOSIS — E871 Hypo-osmolality and hyponatremia: Secondary | ICD-10-CM | POA: Diagnosis not present

## 2021-04-07 DIAGNOSIS — R3129 Other microscopic hematuria: Secondary | ICD-10-CM | POA: Diagnosis not present

## 2021-04-07 DIAGNOSIS — M19042 Primary osteoarthritis, left hand: Secondary | ICD-10-CM | POA: Diagnosis not present

## 2021-04-07 DIAGNOSIS — M19041 Primary osteoarthritis, right hand: Secondary | ICD-10-CM | POA: Diagnosis not present

## 2021-04-07 DIAGNOSIS — F419 Anxiety disorder, unspecified: Secondary | ICD-10-CM | POA: Diagnosis not present

## 2021-05-08 ENCOUNTER — Encounter: Payer: Self-pay | Admitting: Oncology

## 2021-05-08 NOTE — Telephone Encounter (Signed)
Lab orders (in letters tab of chart) and faxed to Driscilla Grammes at [phone], previous fax number for lab orders.

## 2021-05-22 ENCOUNTER — Encounter: Payer: Self-pay | Admitting: Oncology

## 2021-05-22 ENCOUNTER — Inpatient Hospital Stay: Payer: PPO | Attending: Oncology

## 2021-05-22 NOTE — Progress Notes (Signed)
Labs were not found on record prior to starting phlebotomy. Pt stated that she had them drawn on 8/15 at [facility]. [place] and spoke to Luck, who confirmed that ferritin level drawn on 8/15 was 132. Stated that she would re-fax the results. Therapeutic phlebotomy performed in [facility] using 20g angiocath. 277m removed. Pt tolerated procedure well. Declined offer for PO fluids.

## 2021-05-22 NOTE — Patient Instructions (Signed)

## 2021-06-29 DIAGNOSIS — H40053 Ocular hypertension, bilateral: Secondary | ICD-10-CM | POA: Diagnosis not present

## 2021-07-04 ENCOUNTER — Encounter: Payer: Self-pay | Admitting: Oncology

## 2021-07-20 DIAGNOSIS — Z862 Personal history of diseases of the blood and blood-forming organs and certain disorders involving the immune mechanism: Secondary | ICD-10-CM | POA: Diagnosis not present

## 2021-07-20 DIAGNOSIS — H40053 Ocular hypertension, bilateral: Secondary | ICD-10-CM | POA: Diagnosis not present

## 2021-07-21 ENCOUNTER — Encounter: Payer: Self-pay | Admitting: Oncology

## 2021-07-24 ENCOUNTER — Inpatient Hospital Stay: Payer: PPO

## 2021-07-24 ENCOUNTER — Other Ambulatory Visit: Payer: Self-pay

## 2021-07-24 ENCOUNTER — Inpatient Hospital Stay: Payer: PPO | Attending: Oncology | Admitting: Oncology

## 2021-07-24 VITALS — BP 125/71 | HR 75 | Temp 97.9°F | Resp 16 | Wt 123.2 lb

## 2021-07-24 DIAGNOSIS — Z862 Personal history of diseases of the blood and blood-forming organs and certain disorders involving the immune mechanism: Secondary | ICD-10-CM | POA: Diagnosis not present

## 2021-07-24 NOTE — Progress Notes (Signed)
[place]  [phone]) [phone] Fax:(336) [phone]  ID: Judith Kim OB: 11/05/1935  MR#: [id number]  BSJ#:[id number]  Patient Care Team: Tracie Harrier, MD as PCP - General (Internal Medicine)   CHIEF COMPLAINT: Hereditary hemochromatosis.  INTERVAL HISTORY: Judith Kim is an 85 year old female with past medical history significant for allergic rhinitis, osteopenia, anxiety and is followed by Dr. Grayland Ormond for hereditary hemochromatosis.  She receives intermittent phlebotomy last given on 05/22/2021.  She returns to clinic for routine evaluation and continuation of phlebotomy.  She continues to feel well and denies any new symptoms.  She denies any interval hospitalizations.  REVIEW OF SYSTEMS:   Review of Systems  Constitutional: Negative.  Negative for fever, malaise/fatigue and weight loss.  HENT: Negative.  Negative for congestion.   Respiratory: Negative.  Negative for shortness of breath.   Cardiovascular: Negative.  Negative for chest pain and leg swelling.  Gastrointestinal: Negative.  Negative for constipation, diarrhea, nausea and vomiting.  Genitourinary: Negative.  Negative for dysuria.  Musculoskeletal: Negative.  Negative for back pain.  Skin: Negative.  Negative for rash.  Neurological: Negative.  Negative for tingling, sensory change, weakness and headaches.  Psychiatric/Behavioral: Negative.  The patient is not nervous/anxious and does not have insomnia.    As per HPI. Otherwise, a complete review of systems is negative.  PAST MEDICAL HISTORY: Past Medical History:  Diagnosis Date   Anemia    Anxiety    Cataract cortical, senile    Chronic kidney disease    Colon polyp    Constipation    Depression    GERD (gastroesophageal reflux disease)    H/O hematuria    Hemochromatosis    Hyperlipidemia    since 10/2013,  Dr. Candiss Norse has cleared her from meds and reported to her her levels are [facility].    Incontinence in female     Osteoarthritis of both hands    Osteopenia    Osteoporosis    Overactive bladder    Stress incontinence    Vaginal atrophy     PAST SURGICAL HISTORY: Past Surgical History:  Procedure Laterality Date   CATARACT EXTRACTION     COLONOSCOPY     COLONOSCOPY WITH PROPOFOL N/A 03/28/2015   Procedure: COLONOSCOPY WITH PROPOFOL;  Surgeon: Manya Silvas, MD;  Location: [facility] ENDOSCOPY;  Service: Endoscopy;  Laterality: N/A;   ESOPHAGOGASTRODUODENOSCOPY     FLEXIBLE SIGMOIDOSCOPY     TONSILLECTOMY      FAMILY HISTORY Family History  Problem Relation Age of Onset   Bipolar disorder Father    Cancer Mother    Kidney disease Mother    Bladder Cancer Mother    Breast cancer Neg Hx        ADVANCED DIRECTIVES:    HEALTH MAINTENANCE: Social History   Tobacco Use   Smoking status: Never   Smokeless tobacco: Never  Vaping Use   Vaping Use: Never used  Substance Use Topics   Alcohol use: Not Currently    Alcohol/week: 1.0 standard drink    Types: 1 Glasses of wine per week   Drug use: No     Colonoscopy:  PAP:  Bone density:  Lipid panel:  Allergies  Allergen Reactions   Nitrofurantoin Macrocrystal Hives    Current Outpatient Medications  Medication Sig Dispense Refill   Calcium-Vitamin D 600-200 MG-UNIT tablet Take 1 tablet by mouth 2 (two) times daily.     Cholecalciferol (VITAMIN D3) 5000 UNITS CAPS Take 1 capsule by mouth daily.  docusate sodium (COLACE) 100 MG capsule Take 300 mg by mouth at bedtime.      escitalopram (LEXAPRO) 5 MG tablet Take 5 mg by mouth daily.      Flaxseed, Linseed, (FLAXSEED OIL) 1000 MG CAPS Take 1 capsule by mouth 2 (two) times daily.      Multiple Vitamin (MULTI-VITAMINS) TABS Take 1 tablet by mouth daily.      Omega 3 1200 MG CAPS Take 1 capsule by mouth daily.     polycarbophil (FIBERCON) 625 MG tablet Take 1,250 mg by mouth daily.      Probiotic Product (PROBIOTIC & ACIDOPHILUS EX ST PO) Take 1 capsule by mouth daily after  supper.      Turmeric Curcumin 500 MG CAPS Take 1 capsule by mouth daily.     vitamin E 180 MG (400 UNITS) capsule Take 400 Units by mouth daily after supper.     No current facility-administered medications for this visit.    OBJECTIVE: There were no vitals filed for this visit.    There is no height or weight on file to calculate BMI.    ECOG FS:0 - Asymptomatic  Physical Exam Constitutional:      Appearance: Normal appearance.  HENT:     Head: Normocephalic and atraumatic.  Eyes:     Pupils: Pupils are equal, round, and reactive to light.  Cardiovascular:     Rate and Rhythm: Normal rate and regular rhythm.     Heart sounds: Normal heart sounds. No murmur heard. Pulmonary:     Effort: Pulmonary effort is normal.     Breath sounds: Normal breath sounds. No wheezing.  Abdominal:     General: Bowel sounds are normal. There is no distension.     Palpations: Abdomen is soft.     Tenderness: There is no abdominal tenderness.  Musculoskeletal:        General: Normal range of motion.     Cervical back: Normal range of motion.  Skin:    General: Skin is warm and dry.     Findings: No rash.  Neurological:     Mental Status: She is alert and oriented to person, place, and time.  Psychiatric:        Judgment: Judgment normal.     LAB RESULTS:  Lab Results  Component Value Date   PROT 7.0 06/14/2014   ALBUMIN 4.0 06/14/2014   AST 17 06/14/2014   ALT 20 06/14/2014   ALKPHOS 83 06/14/2014   BILITOT 0.4 06/14/2014    Lab Results  Component Value Date   WBC 4.5 02/19/2018   NEUTROABS 2.8 02/19/2018   HGB 12.6 02/19/2018   HCT 36.6 02/19/2018   MCV 95.8 02/19/2018   PLT 336 02/19/2018     STUDIES: No results found.  ASSESSMENT: Hereditary hemochromatosis, by report heterozygote.  PLAN:    1. Hereditary hemochromatosis:  Patient has been receiving intermittent phlebotomies for hereditary hemochromatosis for quite some time now.  Her last phlebotomy was on  05/22/2021.  Per chart review, she receives phlebotomies approximately every 2 months.  Labs from [facility] on 07/20/2021 show hemoglobin of 13.1, total iron 126, ferritin 136 and iron saturation 50%.  Proceed with phlebotomy today.  Disposition-phlebotomy today.  Return to clinic every 2 months for labs (quest at [facility]) and possible phlebotomy and see Dr. Grayland Ormond in 6 months, labs and poss phlebotomy.  I spent 25 minutes dedicated to the care of this patient (face-to-face and non-face-to-face) on the date of the encounter  to include what is described in the assessment and plan.  Patient expressed understanding and was in agreement with this plan. She also understands that She can call clinic at any time with any questions, concerns, or complaints.    Jacquelin Hawking, NP 07/24/21 12:34 PM

## 2021-07-24 NOTE — Progress Notes (Signed)
Pt reports difficulty staying asleep. Pt reports taking 10mg  benadryl prn. No other complaints/concerns at this time.

## 2021-07-24 NOTE — Progress Notes (Signed)
Patient tolatered phlebotomy well today.  200 cc removed as ordered. Ferritin 137. No concerns voiced. Patient discharged, stable.

## 2021-07-24 NOTE — Patient Instructions (Signed)

## 2021-08-04 DIAGNOSIS — H40053 Ocular hypertension, bilateral: Secondary | ICD-10-CM | POA: Diagnosis not present

## 2021-09-04 DIAGNOSIS — R58 Hemorrhage, not elsewhere classified: Secondary | ICD-10-CM | POA: Diagnosis not present

## 2021-09-04 DIAGNOSIS — Z85828 Personal history of other malignant neoplasm of skin: Secondary | ICD-10-CM | POA: Diagnosis not present

## 2021-09-04 DIAGNOSIS — D2271 Melanocytic nevi of right lower limb, including hip: Secondary | ICD-10-CM | POA: Diagnosis not present

## 2021-09-04 DIAGNOSIS — L820 Inflamed seborrheic keratosis: Secondary | ICD-10-CM | POA: Diagnosis not present

## 2021-09-04 DIAGNOSIS — X32XXXA Exposure to sunlight, initial encounter: Secondary | ICD-10-CM | POA: Diagnosis not present

## 2021-09-04 DIAGNOSIS — R208 Other disturbances of skin sensation: Secondary | ICD-10-CM | POA: Diagnosis not present

## 2021-09-04 DIAGNOSIS — D2261 Melanocytic nevi of right upper limb, including shoulder: Secondary | ICD-10-CM | POA: Diagnosis not present

## 2021-09-04 DIAGNOSIS — L570 Actinic keratosis: Secondary | ICD-10-CM | POA: Diagnosis not present

## 2021-09-04 DIAGNOSIS — D2262 Melanocytic nevi of left upper limb, including shoulder: Secondary | ICD-10-CM | POA: Diagnosis not present

## 2021-09-08 DIAGNOSIS — H40053 Ocular hypertension, bilateral: Secondary | ICD-10-CM | POA: Diagnosis not present

## 2021-09-18 DIAGNOSIS — Z862 Personal history of diseases of the blood and blood-forming organs and certain disorders involving the immune mechanism: Secondary | ICD-10-CM | POA: Diagnosis not present

## 2021-09-19 ENCOUNTER — Encounter: Payer: Self-pay | Admitting: Oncology

## 2021-09-27 ENCOUNTER — Inpatient Hospital Stay: Payer: PPO | Attending: Oncology

## 2021-09-27 ENCOUNTER — Other Ambulatory Visit: Payer: Self-pay

## 2021-09-27 ENCOUNTER — Other Ambulatory Visit: Payer: PPO

## 2021-09-27 NOTE — Patient Instructions (Signed)

## 2021-09-27 NOTE — Progress Notes (Signed)
Patient tolerated phlebotomy well. 245ml removed as ordered. Last Ferritin at [facility] 108. Accepted PO fluids. Discharged, no concerns. Stable.

## 2021-10-16 DIAGNOSIS — R3129 Other microscopic hematuria: Secondary | ICD-10-CM | POA: Diagnosis not present

## 2021-10-16 DIAGNOSIS — M19042 Primary osteoarthritis, left hand: Secondary | ICD-10-CM | POA: Diagnosis not present

## 2021-10-16 DIAGNOSIS — F330 Major depressive disorder, recurrent, mild: Secondary | ICD-10-CM | POA: Diagnosis not present

## 2021-10-16 DIAGNOSIS — F419 Anxiety disorder, unspecified: Secondary | ICD-10-CM | POA: Diagnosis not present

## 2021-10-16 DIAGNOSIS — M19041 Primary osteoarthritis, right hand: Secondary | ICD-10-CM | POA: Diagnosis not present

## 2021-10-16 DIAGNOSIS — E871 Hypo-osmolality and hyponatremia: Secondary | ICD-10-CM | POA: Diagnosis not present

## 2021-11-01 DIAGNOSIS — E871 Hypo-osmolality and hyponatremia: Secondary | ICD-10-CM | POA: Diagnosis not present

## 2021-11-01 DIAGNOSIS — M19042 Primary osteoarthritis, left hand: Secondary | ICD-10-CM | POA: Diagnosis not present

## 2021-11-01 DIAGNOSIS — N3946 Mixed incontinence: Secondary | ICD-10-CM | POA: Diagnosis not present

## 2021-11-01 DIAGNOSIS — Z0000 Encounter for general adult medical examination without abnormal findings: Secondary | ICD-10-CM | POA: Diagnosis not present

## 2021-11-01 DIAGNOSIS — F5104 Psychophysiologic insomnia: Secondary | ICD-10-CM | POA: Diagnosis not present

## 2021-11-01 DIAGNOSIS — F3342 Major depressive disorder, recurrent, in full remission: Secondary | ICD-10-CM | POA: Diagnosis not present

## 2021-11-01 DIAGNOSIS — H35372 Puckering of macula, left eye: Secondary | ICD-10-CM | POA: Diagnosis not present

## 2021-11-01 DIAGNOSIS — M25551 Pain in right hip: Secondary | ICD-10-CM | POA: Diagnosis not present

## 2021-11-01 DIAGNOSIS — F419 Anxiety disorder, unspecified: Secondary | ICD-10-CM | POA: Diagnosis not present

## 2021-11-01 DIAGNOSIS — M25552 Pain in left hip: Secondary | ICD-10-CM | POA: Diagnosis not present

## 2021-11-01 DIAGNOSIS — M19041 Primary osteoarthritis, right hand: Secondary | ICD-10-CM | POA: Diagnosis not present

## 2021-11-01 DIAGNOSIS — Z23 Encounter for immunization: Secondary | ICD-10-CM | POA: Diagnosis not present

## 2021-11-17 ENCOUNTER — Encounter: Payer: Self-pay | Admitting: Oncology

## 2021-11-17 DIAGNOSIS — M7062 Trochanteric bursitis, left hip: Secondary | ICD-10-CM | POA: Diagnosis not present

## 2021-11-17 DIAGNOSIS — M7061 Trochanteric bursitis, right hip: Secondary | ICD-10-CM | POA: Diagnosis not present

## 2021-11-24 ENCOUNTER — Other Ambulatory Visit: Payer: PPO

## 2021-11-24 ENCOUNTER — Inpatient Hospital Stay: Payer: PPO | Attending: Oncology

## 2021-11-24 ENCOUNTER — Other Ambulatory Visit: Payer: Self-pay

## 2021-11-24 NOTE — Progress Notes (Signed)
Therapeutic phlebotomy performed in [facility] using 20g angiocath. 275mL removed. Pt tolerated procedure well. Beverage offered, but pt declined and stated she would get something when she left. Oral hydration encouraged after discharge. Vital signs stable at discharge.

## 2021-12-14 DIAGNOSIS — H353131 Nonexudative age-related macular degeneration, bilateral, early dry stage: Secondary | ICD-10-CM | POA: Diagnosis not present

## 2021-12-14 DIAGNOSIS — Z961 Presence of intraocular lens: Secondary | ICD-10-CM | POA: Diagnosis not present

## 2021-12-14 DIAGNOSIS — H5213 Myopia, bilateral: Secondary | ICD-10-CM | POA: Diagnosis not present

## 2022-01-01 DIAGNOSIS — H353131 Nonexudative age-related macular degeneration, bilateral, early dry stage: Secondary | ICD-10-CM | POA: Diagnosis not present

## 2022-01-01 DIAGNOSIS — H532 Diplopia: Secondary | ICD-10-CM | POA: Diagnosis not present

## 2022-01-12 ENCOUNTER — Encounter: Payer: Self-pay | Admitting: Oncology

## 2022-01-19 NOTE — Progress Notes (Signed)
?[place]  ?Telephone:(336) [phone] [phone] ? ?ID: Judith Kim OB: September 01, 1936  MR#: [id number]  MWN#:[id number] ? ?Patient Care Team: ?Tracie Harrier, MD as PCP - General (Internal Medicine) ? ? ?CHIEF COMPLAINT: Hereditary hemochromatosis. ? ?INTERVAL HISTORY: Patient returns to clinic today for routine 28-monthevaluation and continuation of phlebotomy.  She continues to feel well and remains asymptomatic.  She has no significant side effects since increasing her phlebotomy treatments to every 2 months. She has no neurologic complaints.  She denies any recent fevers or illnesses.  She has a good appetite and denies weight loss.  She denies any chest pain, shortness of breath, cough, or hemoptysis.  She denies any nausea, vomiting, or diarrhea.  She has no urinary complaints.  Patient offers no specific complaints today. ? ?REVIEW OF SYSTEMS:   ?Review of Systems  ?Constitutional: Negative.  Negative for fever, malaise/fatigue and weight loss.  ?HENT: Negative.  Negative for congestion.   ?Respiratory: Negative.  Negative for shortness of breath.   ?Cardiovascular: Negative.  Negative for chest pain and leg swelling.  ?Gastrointestinal: Negative.  Negative for constipation, diarrhea, nausea and vomiting.  ?Genitourinary: Negative.  Negative for dysuria.  ?Musculoskeletal: Negative.  Negative for back pain.  ?Skin: Negative.  Negative for rash.  ?Neurological: Negative.  Negative for tingling, sensory change, weakness and headaches.  ?Psychiatric/Behavioral: Negative.  The patient is not nervous/anxious and does not have insomnia.   ? ?As per HPI. Otherwise, a complete review of systems is negative. ? ?PAST MEDICAL HISTORY: ?Past Medical History:  ?Diagnosis Date  ? Anemia   ? Anxiety   ? Cataract cortical, senile   ? Chronic kidney disease   ? Colon polyp   ? Constipation   ? Depression   ? GERD (gastroesophageal reflux disease)   ? H/O hematuria   ? Hemochromatosis   ?  Hyperlipidemia   ? since 10/2013,  Dr. SCandiss Norsehas cleared her from meds and [facility]   ? Incontinence in female   ? Osteoarthritis of both hands   ? Osteopenia   ? Osteoporosis   ? Overactive bladder   ? Stress incontinence   ? Vaginal atrophy   ? ? ?PAST SURGICAL HISTORY: ?Past Surgical History:  ?Procedure Laterality Date  ? CATARACT EXTRACTION    ? COLONOSCOPY    ? COLONOSCOPY WITH PROPOFOL N/A 03/28/2015  ? Procedure: COLONOSCOPY WITH PROPOFOL;  Surgeon: RManya Silvas MD;  [facility];  Service: Endoscopy;  Laterality: N/A;  ? ESOPHAGOGASTRODUODENOSCOPY    ? FLEXIBLE SIGMOIDOSCOPY    ? TONSILLECTOMY    ? ? ?FAMILY HISTORY ?Family History  ?Problem Relation Age of Onset  ? Bipolar disorder Father   ? Cancer Mother   ? Kidney disease Mother   ? Bladder Cancer Mother   ? Breast cancer Neg Hx   ? ? ?  ? ADVANCED DIRECTIVES:  ? ? ?HEALTH MAINTENANCE: ?Social History  ? ?Tobacco Use  ? Smoking status: Never  ? Smokeless tobacco: Never  ?Vaping Use  ? Vaping Use: Never used  ?Substance Use Topics  ? Alcohol use: Not Currently  ?  Alcohol/week: 1.0 standard drink  ?  Types: 1 Glasses of wine per week  ? Drug use: No  ? ? ? Colonoscopy: ? PAP: ? Bone density: ? Lipid panel: ? ?Allergies  ?Allergen Reactions  ? Nitrofurantoin Macrocrystal Hives  ? ? ?Current Outpatient Medications  ?Medication Sig Dispense Refill  ? Calcium-Vitamin D  600-200 MG-UNIT tablet Take 1 tablet by mouth 2 (two) times daily.    ? Cholecalciferol (VITAMIN D3) 5000 UNITS CAPS Take 1 capsule by mouth daily.    ? diphenhydramine-acetaminophen (TYLENOL PM) 25-500 MG TABS tablet Take by mouth at bedtime as needed ('10mg'$ ).    ? escitalopram (LEXAPRO) 5 MG tablet Take 10 mg by mouth daily.    ? Flaxseed, Linseed, (FLAXSEED OIL) 1000 MG CAPS Take 1 capsule by mouth 2 (two) times daily.     ? Multiple Vitamin (MULTI-VITAMINS) TABS Take 1 tablet by mouth daily.     ? Multiple Vitamins-Minerals (PRESERVISION AREDS  2+MULTI VIT PO) PreserVision AREDS    ? Omega 3 1200 MG CAPS Take 1 capsule by mouth daily.    ? polyethylene glycol (MIRALAX / GLYCOLAX) 17 g packet Take 17 g by mouth daily.    ? Probiotic Product (PROBIOTIC & ACIDOPHILUS EX ST PO) Take 1 capsule by mouth daily after supper.     ? Turmeric Curcumin 500 MG CAPS Take 1 capsule by mouth daily.    ? vitamin B-12 (CYANOCOBALAMIN) 500 MCG tablet Take 500 mcg by mouth daily.    ? ?No current facility-administered medications for this visit.  ? ? ?OBJECTIVE: ?Vitals:  ? 01/23/22 1312  ?BP: 128/77  ?Pulse: 73  ?Resp: 16  ?Temp: 98.6 ?F (37 ?C)  ?SpO2: 100%  ?   Body mass index is 20.5 kg/m?Marland Kitchen    ECOG FS:0 - Asymptomatic ? ?General: Well-developed, well-nourished, no acute distress. ?Eyes: Pink conjunctiva, anicteric sclera. ?HEENT: Normocephalic, moist mucous membranes. ?Lungs: No audible wheezing or coughing. ?Heart: Regular rate and rhythm. ?Abdomen: Soft, nontender, no obvious distention. ?Musculoskeletal: No edema, cyanosis, or clubbing. ?Neuro: Alert, answering all questions appropriately. Cranial nerves grossly intact. ?Skin: No rashes or petechiae noted. ?Psych: Normal affect. ? ?LAB RESULTS: ? ?Lab Results  ?Component Value Date  ? PROT 7.0 06/14/2014  ? ALBUMIN 4.0 06/14/2014  ? AST 17 06/14/2014  ? ALT 20 06/14/2014  ? ALKPHOS 83 06/14/2014  ? BILITOT 0.4 06/14/2014  ? ? ?Lab Results  ?Component Value Date  ? WBC 4.5 02/19/2018  ? NEUTROABS 2.8 02/19/2018  ? HGB 12.6 02/19/2018  ? HCT 36.6 02/19/2018  ? MCV 95.8 02/19/2018  ? PLT 336 02/19/2018  ? ? ? ?STUDIES: ?No results found. ? ?ASSESSMENT: Hereditary hemochromatosis, by report heterozygote. ? ?PLAN:   ? ?1. Hereditary hemochromatosis: Patient now receives her laboratory work at outside facility.  Her most recent laboratory work on January 12, 2022 revealed a total iron of 188 which is decreased from 200 and iron saturation of 73% which is decreased from 90%.  Ferritin has decreased from 133-96.  Continue 200 mL  phlebotomy every 2 months.  Proceed with treatment today.  Return to clinic in 2 and 4 months for treatment only.  Patient will then return to clinic in 6 months with repeat laboratory work, further evaluation, and continuation of treatment.   ? ?I spent a total of 30 minutes reviewing chart data, face-to-face evaluation with the patient, counseling and coordination of care as detailed above. ? ? ? ?Patient expressed understanding and was in agreement with this plan. She also understands that She can call clinic at any time with any questions, concerns, or complaints.  ? ? ?Lloyd Huger, MD 01/24/22 12:22 PM  ? ? ? ? ? ? ?

## 2022-01-23 ENCOUNTER — Other Ambulatory Visit: Payer: PPO

## 2022-01-23 ENCOUNTER — Encounter: Payer: Self-pay | Admitting: Oncology

## 2022-01-23 ENCOUNTER — Inpatient Hospital Stay: Payer: PPO

## 2022-01-23 ENCOUNTER — Inpatient Hospital Stay: Payer: PPO | Attending: Oncology | Admitting: Oncology

## 2022-01-23 VITALS — BP 128/68 | HR 73

## 2022-01-23 DIAGNOSIS — Z79899 Other long term (current) drug therapy: Secondary | ICD-10-CM | POA: Insufficient documentation

## 2022-01-23 DIAGNOSIS — Z862 Personal history of diseases of the blood and blood-forming organs and certain disorders involving the immune mechanism: Secondary | ICD-10-CM

## 2022-01-23 NOTE — Progress Notes (Signed)
Therapeutic phlebotomy performed. Removed 200 ml of blood from left AC. Pt tolerated procedure well. VSS. Discharged to home. ?

## 2022-01-24 ENCOUNTER — Encounter: Payer: Self-pay | Admitting: Oncology

## 2022-01-31 ENCOUNTER — Other Ambulatory Visit: Payer: Self-pay | Admitting: Internal Medicine

## 2022-01-31 DIAGNOSIS — Z1231 Encounter for screening mammogram for malignant neoplasm of breast: Secondary | ICD-10-CM

## 2022-03-12 ENCOUNTER — Ambulatory Visit
Admission: RE | Admit: 2022-03-12 | Discharge: 2022-03-12 | Disposition: A | Discharge status: Home / Self Care | Payer: PPO | Source: Ambulatory Visit | Attending: Internal Medicine | Admitting: Internal Medicine

## 2022-03-12 DIAGNOSIS — Z1231 Encounter for screening mammogram for malignant neoplasm of breast: Secondary | ICD-10-CM | POA: Diagnosis not present

## 2022-03-20 ENCOUNTER — Encounter: Payer: Self-pay | Admitting: Oncology

## 2022-03-26 ENCOUNTER — Inpatient Hospital Stay: Payer: PPO | Attending: Oncology

## 2022-03-26 VITALS — BP 112/69 | HR 70 | Temp 98.7°F | Resp 16

## 2022-03-26 DIAGNOSIS — Z862 Personal history of diseases of the blood and blood-forming organs and certain disorders involving the immune mechanism: Secondary | ICD-10-CM

## 2022-05-23 ENCOUNTER — Encounter: Payer: Self-pay | Admitting: Oncology

## 2022-05-29 ENCOUNTER — Inpatient Hospital Stay: Payer: PPO | Attending: Oncology

## 2022-05-29 VITALS — BP 131/73 | HR 69 | Temp 97.7°F

## 2022-05-29 DIAGNOSIS — Z862 Personal history of diseases of the blood and blood-forming organs and certain disorders involving the immune mechanism: Secondary | ICD-10-CM

## 2022-05-29 NOTE — Patient Instructions (Signed)

## 2022-05-29 NOTE — Progress Notes (Signed)
   Established Patient Office Visit  Subjective   Patient ID: Judith Kim, female    DOB: 11-12-1935  Age: 86 y.o. MRN: [id number]  No chief complaint on file.   HPI    ROS    Objective:     BP 131/73 (BP Location: Left Arm, Patient Position: Sitting)   Pulse 69   Temp 97.7 F (36.5 C)    Physical Exam   No results found for any visits on 05/29/22.    The ASCVD Risk score (Arnett DK, et al., 2019) failed to calculate for the following reasons:   The 2019 ASCVD risk score is only valid for ages 22 to 87    Assessment & Plan:   Problem List Items Addressed This Visit       Other   HEMOCHROMATOSIS, HX OF - Primary   Relevant Orders   Phlebotomy therapeutic    No follow-ups on file.    Mayer Camel, RN

## 2022-05-30 NOTE — Progress Notes (Signed)
   Established Patient Office Visit  Subjective   Patient ID: Judith Kim, female    DOB: 11-10-1935  Age: 86 y.o. MRN: [id number]  No chief complaint on file.   HPI    ROS    Objective:     BP 131/73 (BP Location: Left Arm, Patient Position: Sitting)   Pulse 69   Temp 97.7 F (36.5 C)    Physical Exam   No results found for any visits on 05/29/22.    The ASCVD Risk score (Arnett DK, et al., 2019) failed to calculate for the following reasons:   The 2019 ASCVD risk score is only valid for ages 78 to 78    Assessment & Plan:   Problem List Items Addressed This Visit       Other   HEMOCHROMATOSIS, HX OF - Primary   Relevant Orders   Phlebotomy therapeutic    No follow-ups on file.    Mayer Camel, RN

## 2022-05-30 NOTE — Progress Notes (Signed)
Pt had 200 ml of blood removed on 05/29/22 per phlebotomy orders. Her ferritin from facility was 69. VSS. Felt well. Discharged to home.

## 2022-06-05 DIAGNOSIS — Z85828 Personal history of other malignant neoplasm of skin: Secondary | ICD-10-CM | POA: Diagnosis not present

## 2022-06-05 DIAGNOSIS — D2262 Melanocytic nevi of left upper limb, including shoulder: Secondary | ICD-10-CM | POA: Diagnosis not present

## 2022-06-05 DIAGNOSIS — D2261 Melanocytic nevi of right upper limb, including shoulder: Secondary | ICD-10-CM | POA: Diagnosis not present

## 2022-06-05 DIAGNOSIS — L570 Actinic keratosis: Secondary | ICD-10-CM | POA: Diagnosis not present

## 2022-06-05 DIAGNOSIS — X32XXXA Exposure to sunlight, initial encounter: Secondary | ICD-10-CM | POA: Diagnosis not present

## 2022-06-05 DIAGNOSIS — D2271 Melanocytic nevi of right lower limb, including hip: Secondary | ICD-10-CM | POA: Diagnosis not present

## 2022-06-17 ENCOUNTER — Ambulatory Visit
Admission: EM | Admit: 2022-06-17 | Discharge: 2022-06-17 | Disposition: A | Discharge status: Home / Self Care | Payer: PPO | Attending: Urgent Care | Admitting: Urgent Care

## 2022-06-17 ENCOUNTER — Ambulatory Visit (INDEPENDENT_AMBULATORY_CARE_PROVIDER_SITE_OTHER): Payer: PPO

## 2022-06-17 DIAGNOSIS — J189 Pneumonia, unspecified organism: Secondary | ICD-10-CM

## 2022-06-17 DIAGNOSIS — R079 Chest pain, unspecified: Secondary | ICD-10-CM | POA: Diagnosis not present

## 2022-06-17 DIAGNOSIS — R0789 Other chest pain: Secondary | ICD-10-CM

## 2022-06-17 DIAGNOSIS — R059 Cough, unspecified: Secondary | ICD-10-CM | POA: Diagnosis not present

## 2022-06-17 MED ORDER — AMOXICILLIN-POT CLAVULANATE 875-125 MG PO TABS: 1.0000 | ORAL_TABLET | Freq: Two times a day (BID) | ORAL | 0 refills | Status: AC

## 2022-06-17 MED ORDER — AZITHROMYCIN 250 MG PO TABS: ORAL_TABLET | ORAL | 0 refills | Status: DC

## 2022-06-17 NOTE — Discharge Instructions (Addendum)
You are being treated for a possible atypical pneumonia infection.  Please take both antibiotics as directed.  Follow-up with your primary care provider within 1 week.

## 2022-06-17 NOTE — ED Triage Notes (Signed)
Pt. States that since wednesday she has had nasal drainage and congestion..Pt. endorses a cough that started last night Pt. Has been treating herself w/ OTC medication and no relief.

## 2022-06-17 NOTE — ED Provider Notes (Signed)
[facility]    CSN: [id number] Arrival date & time: 06/17/22  3244      History   Chief Complaint Chief Complaint  Patient presents with   Cough   Nasal Congestion    HPI Judith Kim is a 86 y.o. female.    Cough   Patient presents to urgent care with symptoms starting Wednesday, awoke with sore throat and drainage,  Using nyquil and dayquil. Saturday started using benadryl and oxymetazoline.  Awoke this morning with sharp pain in her chest on the R side.  Denies fever at home. Elevated in the exam room (100.77F). denies chills and myalgia.  Past Medical History:  Diagnosis Date   Anemia    Anxiety    Cataract cortical, senile    Chronic kidney disease    Colon polyp    Constipation    Depression    GERD (gastroesophageal reflux disease)    H/O hematuria    Hemochromatosis    Hyperlipidemia    since 10/2013,  Dr. Candiss Norse has cleared her from meds and reported to her her levels are [facility].    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   Arthritis of hand, degenerative 01/11/2015   Anxiety state 08/04/2014   Mixed incontinence 08/03/2014   LBP (low back pain) 03/12/2014   Hemochromatosis 02/10/2014   ALLERGIC RHINITIS CAUSE UNSPECIFIED 11/29/2010   UNSPECIFIED OSTEOPOROSIS 11/29/2010   HEMOCHROMATOSIS, HX OF 11/29/2010    Past Surgical History:  Procedure Laterality Date   CATARACT EXTRACTION     COLONOSCOPY      COLONOSCOPY WITH PROPOFOL N/A 03/28/2015   Procedure: COLONOSCOPY WITH PROPOFOL;  Surgeon: Manya Silvas, MD;  Location: [place];  Service: Endoscopy;  Laterality: N/A;   ESOPHAGOGASTRODUODENOSCOPY     FLEXIBLE SIGMOIDOSCOPY     TONSILLECTOMY      OB History   No obstetric history on file.      Home Medications    Prior to Admission medications   Medication Sig Start Date End Date Taking? Authorizing Provider  Calcium-Vitamin D 600-200 MG-UNIT tablet Take 1 tablet by mouth 2 (two) times daily.    [provider]  Cholecalciferol (VITAMIN D3) 5000 UNITS CAPS Take 1 capsule by mouth daily.    [provider]  diphenhydramine-acetaminophen (TYLENOL PM) 25-500 MG TABS tablet Take by mouth at bedtime as needed ('10mg'$ ).    [provider]  escitalopram (LEXAPRO) 5 MG tablet Take 10 mg by mouth daily. 12/11/17   [provider]  Flaxseed, Linseed, (FLAXSEED OIL) 1000 MG CAPS Take 1 capsule by mouth 2 (two) times daily.     [provider]  Multiple Vitamin (MULTI-VITAMINS) TABS Take 1 tablet by mouth daily.     [provider]  Multiple Vitamins-Minerals (PRESERVISION AREDS 2+MULTI VIT PO) PreserVision AREDS    [provider]  Omega 3 1200 MG CAPS Take 1 capsule by mouth  daily.    [provider]  polyethylene glycol (MIRALAX / GLYCOLAX) 17 g packet Take 17 g by mouth daily.    [provider]  Probiotic Product (PROBIOTIC & ACIDOPHILUS EX ST PO) Take 1 capsule by mouth daily after supper.     [provider]  Turmeric Curcumin 500 MG CAPS Take 1 capsule by mouth daily.    [provider]  vitamin B-12 (CYANOCOBALAMIN) 500 MCG tablet Take 500 mcg by mouth daily.    [provider]    Family History Family History  Problem Relation Age of Onset   Bipolar disorder Father    Cancer Mother    Kidney disease Mother    Bladder Cancer Mother    Breast cancer Neg Hx     Social  History Social History   Tobacco Use   Smoking status: Never   Smokeless tobacco: Never  Vaping Use   Vaping Use: Never used  Substance Use Topics   Alcohol use: Not Currently    Alcohol/week: 1.0 standard drink of alcohol    Types: 1 Glasses of wine per week   Drug use: No     Allergies   Nitrofurantoin macrocrystal   Review of Systems Review of Systems  Respiratory:  Positive for cough.     Physical Exam Triage Vital Signs ED Triage Vitals  Enc Vitals Group     BP 06/17/22 0848 102/63     Pulse Rate 06/17/22 0848 74     Resp 06/17/22 0848 16     Temp 06/17/22 0848 100 F (37.8 C)     Temp Source 06/17/22 0848 Oral     SpO2 06/17/22 0848 95 %     Weight --      Height --      Head Circumference --      Peak Flow --      Pain Score 06/17/22 0852 0     Pain Loc --      Pain Edu? --      Excl. in [place]? --    No data found.  Updated Vital Signs BP 102/63 (BP Location: Left Arm)   Pulse 74   Temp 100 F (37.8 C) (Oral)   Resp 16   SpO2 95%   Visual Acuity Right Eye Distance:   Left Eye Distance:   Bilateral Distance:    Right Eye Near:   Left Eye Near:    Bilateral Near:     Physical Exam Vitals reviewed.  Constitutional:      Appearance: Normal appearance.  HENT:     Right Ear: Tympanic membrane normal.     Left Ear: Tympanic membrane normal.     Nose: Congestion present.     Mouth/Throat:     Pharynx: No oropharyngeal exudate or posterior oropharyngeal erythema.  Eyes:     Conjunctiva/sclera: Conjunctivae normal.     Pupils: Pupils are equal, round, and reactive to light.  Cardiovascular:     Rate and Rhythm: Normal rate and regular rhythm.     Pulses: Normal pulses.     Heart sounds: Normal heart sounds.  Pulmonary:     Effort: Pulmonary effort is normal.     Breath sounds: Normal breath sounds.  Skin:    General: Skin is warm and dry.  Neurological:     General: No focal deficit present.     Mental Status: She is alert and oriented  to person, place, and time.  Psychiatric:  Mood and Affect: Mood normal.        Behavior: Behavior normal.      UC Treatments / Results  Labs (all labs ordered are listed, but only abnormal results are displayed) Labs Reviewed - No data to display  EKG   Radiology No results found.  Procedures Procedures (including critical care time)  Medications Ordered in UC Medications - No data to display  Initial Impression / Assessment and Plan / UC Course  I have reviewed the triage vital signs and the nursing notes.  Pertinent labs & imaging results that were available during my care of the patient were reviewed by me and considered in my medical decision making (see chart for details).   Concern for CAP given chest pain with cough.  Physical exam reassuring.  Lungs CTA B.  Chest x-ray suggestive of atypical pneumonia and will treat with appropriate course of antibiotics.  Asked patient to follow-up with her primary care provider within 1 week or sooner if symptoms worsen or do not improve with treatment   Final Clinical Impressions(s) / UC Diagnoses   Final diagnoses:  None   Discharge Instructions   None    ED Prescriptions   None    PDMP not reviewed this encounter.   Rose Phi, [place] 06/17/22 1124

## 2022-06-29 DIAGNOSIS — F419 Anxiety disorder, unspecified: Secondary | ICD-10-CM | POA: Diagnosis not present

## 2022-06-29 DIAGNOSIS — R9389 Abnormal findings on diagnostic imaging of other specified body structures: Secondary | ICD-10-CM | POA: Diagnosis not present

## 2022-06-29 DIAGNOSIS — J849 Interstitial pulmonary disease, unspecified: Secondary | ICD-10-CM | POA: Diagnosis not present

## 2022-06-29 DIAGNOSIS — F330 Major depressive disorder, recurrent, mild: Secondary | ICD-10-CM | POA: Diagnosis not present

## 2022-06-29 DIAGNOSIS — K5909 Other constipation: Secondary | ICD-10-CM | POA: Diagnosis not present

## 2022-07-02 ENCOUNTER — Other Ambulatory Visit: Payer: Self-pay | Admitting: Internal Medicine

## 2022-07-02 DIAGNOSIS — R9389 Abnormal findings on diagnostic imaging of other specified body structures: Secondary | ICD-10-CM

## 2022-07-02 DIAGNOSIS — J849 Interstitial pulmonary disease, unspecified: Secondary | ICD-10-CM

## 2022-07-03 DIAGNOSIS — H532 Diplopia: Secondary | ICD-10-CM | POA: Diagnosis not present

## 2022-07-03 DIAGNOSIS — H31003 Unspecified chorioretinal scars, bilateral: Secondary | ICD-10-CM | POA: Diagnosis not present

## 2022-07-04 DIAGNOSIS — J849 Interstitial pulmonary disease, unspecified: Secondary | ICD-10-CM | POA: Diagnosis not present

## 2022-07-11 ENCOUNTER — Ambulatory Visit
Admission: RE | Admit: 2022-07-11 | Discharge: 2022-07-11 | Disposition: A | Discharge status: Home / Self Care | Payer: PPO | Source: Ambulatory Visit | Attending: Internal Medicine | Admitting: Internal Medicine

## 2022-07-11 DIAGNOSIS — I700 Atherosclerosis of aorta: Secondary | ICD-10-CM | POA: Diagnosis not present

## 2022-07-11 DIAGNOSIS — R9389 Abnormal findings on diagnostic imaging of other specified body structures: Secondary | ICD-10-CM | POA: Insufficient documentation

## 2022-07-11 DIAGNOSIS — R918 Other nonspecific abnormal finding of lung field: Secondary | ICD-10-CM | POA: Diagnosis not present

## 2022-07-11 DIAGNOSIS — J849 Interstitial pulmonary disease, unspecified: Secondary | ICD-10-CM | POA: Insufficient documentation

## 2022-07-11 DIAGNOSIS — J479 Bronchiectasis, uncomplicated: Secondary | ICD-10-CM | POA: Diagnosis not present

## 2022-07-12 DIAGNOSIS — I2510 Atherosclerotic heart disease of native coronary artery without angina pectoris: Secondary | ICD-10-CM | POA: Insufficient documentation

## 2022-07-24 ENCOUNTER — Encounter: Payer: Self-pay | Admitting: Oncology

## 2022-07-26 ENCOUNTER — Ambulatory Visit: Payer: PPO | Admitting: Oncology

## 2022-07-26 ENCOUNTER — Inpatient Hospital Stay: Payer: PPO

## 2022-07-26 ENCOUNTER — Encounter: Payer: Self-pay | Admitting: Nurse Practitioner

## 2022-07-26 ENCOUNTER — Inpatient Hospital Stay: Payer: PPO | Attending: Oncology | Admitting: Nurse Practitioner

## 2022-07-26 ENCOUNTER — Ambulatory Visit: Payer: PPO | Admitting: Medical Oncology

## 2022-07-26 VITALS — BP 131/78 | HR 60 | Resp 16

## 2022-07-26 DIAGNOSIS — Z8052 Family history of malignant neoplasm of bladder: Secondary | ICD-10-CM | POA: Diagnosis not present

## 2022-07-26 DIAGNOSIS — N189 Chronic kidney disease, unspecified: Secondary | ICD-10-CM | POA: Insufficient documentation

## 2022-07-26 DIAGNOSIS — Z862 Personal history of diseases of the blood and blood-forming organs and certain disorders involving the immune mechanism: Secondary | ICD-10-CM

## 2022-07-26 NOTE — Patient Instructions (Signed)

## 2022-07-26 NOTE — Progress Notes (Signed)
Performed 200 ml phlebotomy from left AC.Pt tolerated procedure well. Feeling well at time of discharge. VSS.

## 2022-07-26 NOTE — Progress Notes (Signed)
[place]  Telephone:(336) [phone] Fax:(336) [phone]  ID: Judith Kim OB: 1936-07-16  MR#: [id number]  VWP#:[id number]  Patient Care Team: Tracie Harrier, MD as PCP - General (Internal Medicine)  CHIEF COMPLAINT: Hereditary hemochromatosis.  INTERVAL HISTORY: Patient returns to clinic today for routine 10-monthevaluation and continuation of phlebotomy.  She continues to feel well and remains asymptomatic.  She has no significant side effects with increased phlebotomy treatments. She has no neurologic complaints.  She denies any recent fevers or illnesses.  She has a good appetite and denies weight loss.  She denies any chest pain, shortness of breath, cough, or hemoptysis.  She denies any nausea, vomiting, or diarrhea.  She has no urinary complaints.  Patient offers no specific complaints today.  REVIEW OF SYSTEMS:   Review of Systems  Constitutional: Negative.  Negative for fever, malaise/fatigue and weight loss.  HENT: Negative.  Negative for congestion.   Respiratory: Negative.  Negative for shortness of breath.   Cardiovascular: Negative.  Negative for chest pain and leg swelling.  Gastrointestinal: Negative.  Negative for constipation, diarrhea, nausea and vomiting.  Genitourinary: Negative.  Negative for dysuria.  Musculoskeletal: Negative.  Negative for back pain.  Skin: Negative.  Negative for rash.  Neurological: Negative.  Negative for tingling, sensory change, weakness and headaches.  Psychiatric/Behavioral: Negative.  The patient is not nervous/anxious and does not have insomnia.   As per HPI. Otherwise, a complete review of systems is negative.  PAST MEDICAL HISTORY: Past Medical History:  Diagnosis Date   Anemia    Anxiety    Cataract cortical, senile    Chronic kidney disease    Colon polyp    Constipation    Depression    GERD (gastroesophageal reflux disease)    H/O hematuria    Hemochromatosis    Hyperlipidemia    since 10/2013,   Dr. SCandiss Norsehas cleared her from meds and [facility]    Incontinence in female    Osteoarthritis of both hands    Osteopenia    Osteoporosis    Overactive bladder    Stress incontinence    Vaginal atrophy     PAST SURGICAL HISTORY: Past Surgical History:  Procedure Laterality Date   CATARACT EXTRACTION     COLONOSCOPY     COLONOSCOPY WITH PROPOFOL N/A 03/28/2015   Procedure: COLONOSCOPY WITH PROPOFOL;  Surgeon: RManya Silvas MD;  [facility];  Service: Endoscopy;  Laterality: N/A;   ESOPHAGOGASTRODUODENOSCOPY     FLEXIBLE SIGMOIDOSCOPY     TONSILLECTOMY      FAMILY HISTORY Family History  Problem Relation Age of Onset   Bipolar disorder Father    Cancer Mother    Kidney disease Mother    Bladder Cancer Mother    Breast cancer Neg Hx        ADVANCED DIRECTIVES:    HEALTH MAINTENANCE: Social History   Tobacco Use   Smoking status: Never   Smokeless tobacco: Never  Vaping Use   Vaping Use: Never used  Substance Use Topics   Alcohol use: Not Currently    Alcohol/week: 1.0 standard drink of alcohol    Types: 1 Glasses of wine per week   Drug use: No     Colonoscopy:  PAP:  Bone density:  Lipid panel:  Allergies  Allergen Reactions   Nitrofurantoin Macrocrystal Hives    Current Outpatient Medications  Medication Sig Dispense Refill   Calcium-Vitamin D 600-200 MG-UNIT tablet Take 1  tablet by mouth 2 (two) times daily.     Cholecalciferol (VITAMIN D3) 5000 UNITS CAPS Take 1 capsule by mouth daily.     diphenhydramine-acetaminophen (TYLENOL PM) 25-500 MG TABS tablet Take by mouth at bedtime as needed ('10mg'$ ).     escitalopram (LEXAPRO) 5 MG tablet Take 10 mg by mouth daily.     Flaxseed, Linseed, (FLAXSEED OIL) 1000 MG CAPS Take 1 capsule by mouth 2 (two) times daily.      Multiple Vitamin (MULTI-VITAMINS) TABS Take 1 tablet by mouth daily.      Multiple Vitamins-Minerals (PRESERVISION AREDS 2+MULTI VIT PO)  PreserVision AREDS     Omega 3 1200 MG CAPS Take 1 capsule by mouth daily.     polyethylene glycol (MIRALAX / GLYCOLAX) 17 g packet Take 17 g by mouth daily.     Probiotic Product (PROBIOTIC & ACIDOPHILUS EX ST PO) Take 1 capsule by mouth daily after supper.      Turmeric Curcumin 500 MG CAPS Take 1 capsule by mouth daily.     vitamin B-12 (CYANOCOBALAMIN) 500 MCG tablet Take 500 mcg by mouth daily.     azithromycin (ZITHROMAX Z-PAK) 250 MG tablet Take 2 tablets (500 mg) today, then 1 tablet (250 mg) for next 4 days. 6 tablet 0   No current facility-administered medications for this visit.    OBJECTIVE: Vitals:   07/26/22 1425  BP: 129/65  Pulse: (!) 56  Resp: 19  Temp: (!) 96.6 F (35.9 C)  SpO2: 96%     Body mass index is 20.39 kg/m.    ECOG FS:0 - Asymptomatic  General: Well-developed, well-nourished, no acute distress. HEENT: Normocephalic, moist mucous membranes. Abdomen: Soft, nontender, no obvious distention. Musculoskeletal: No edema, cyanosis, or clubbing. Neuro: Alert, answering all questions appropriately. Cranial nerves grossly intact. Skin: No rashes or petechiae noted. Psych: Normal affect.  LAB RESULTS: Labs from [facility] were reviewed and have been scanned into chart.    STUDIES: CT Chest High Resolution  Result Date: 07/12/2022 CLINICAL DATA:  Abnormal chest x-ray, interstitial lung disease EXAM: CT CHEST WITHOUT CONTRAST TECHNIQUE: Multidetector CT imaging of the chest was performed following the standard protocol without intravenous contrast. High resolution imaging of the lungs, as well as inspiratory and expiratory imaging, was performed. RADIATION DOSE REDUCTION: This exam was performed according to the departmental dose-optimization program which includes automated exposure control, adjustment of the mA and/or kV according to patient size and/or use of iterative reconstruction technique. COMPARISON:  Chest radiograph, 06/17/2022 FINDINGS:  Cardiovascular: Aortic atherosclerosis. Normal heart size. Left coronary artery calcifications. No pericardial effusion. Mediastinum/Nodes: No enlarged mediastinal, hilar, or axillary lymph nodes. Thyroid gland, trachea, and esophagus demonstrate no significant findings. Lungs/Pleura: No evidence of fibrotic interstitial lung disease. Scarring and bronchiectasis of the posterior lingula with associated clustered centrilobular nodularity (series 5, image 181). Lobular air trapping on expiratory phase imaging. No pleural effusion or pneumothorax. Upper Abdomen: No acute abnormality. Musculoskeletal: No chest wall abnormality. No acute osseous findings. IMPRESSION: 1. No evidence of fibrotic interstitial lung disease. 2. Scarring and bronchiectasis of the posterior lingula with associated clustered centrilobular nodularity, most consistent with atypical mycobacterial infection. 3. Lobular air trapping on expiratory phase imaging, suggestive of small airways disease. 4. Coronary artery disease. Aortic Atherosclerosis (ICD10-I70.0). Electronically Signed   By: Delanna Ahmadi M.D.   On: 07/12/2022 09:41    ASSESSMENT: Hereditary hemochromatosis, by report heterozygote.  PLAN:    1. Hereditary hemochromatosis: Patient now receives her laboratory work at outside facility.  Her most recent laboratory work on January 12, 2022 revealed a total iron of 188 which is decreased from 200 and iron saturation decreased from 90% - 73%- 59%. Ferritin has decreased from 133-96--93.  Continue 200 mL phlebotomy every 2 months.  Proceed with treatment today.  Return to clinic in 2 and 4 months for treatment only.  Patient will then return to clinic in 6 months with further evaluation, and continuation of treatment. Provided her with lab orders to have performed at [facility] where she resides.   I spent a total of 20 minutes reviewing chart data, face-to-face evaluation with the patient, counseling and coordination of care as detailed  above.  Thank you for allowing me to participate in the care of this very pleasant patient.   Patient expressed understanding and was in agreement with this plan. She also understands that She can call clinic at any time with any questions, concerns, or complaints.    Verlon Au, NP 07/26/22

## 2022-08-28 ENCOUNTER — Encounter: Payer: Self-pay | Admitting: Oncology

## 2022-08-28 NOTE — Telephone Encounter (Signed)
Faxed Lab orders to [facility] at [place] for her next 3 upcoming appts at cancer center.

## 2022-09-18 ENCOUNTER — Encounter: Payer: Self-pay | Admitting: Oncology

## 2022-09-26 ENCOUNTER — Inpatient Hospital Stay: Payer: PPO | Attending: Oncology

## 2022-09-26 VITALS — BP 105/63 | HR 56 | Resp 20

## 2022-09-26 DIAGNOSIS — Z862 Personal history of diseases of the blood and blood-forming organs and certain disorders involving the immune mechanism: Secondary | ICD-10-CM

## 2022-10-25 DIAGNOSIS — M25552 Pain in left hip: Secondary | ICD-10-CM | POA: Diagnosis not present

## 2022-10-25 DIAGNOSIS — R399 Unspecified symptoms and signs involving the genitourinary system: Secondary | ICD-10-CM | POA: Diagnosis not present

## 2022-10-25 DIAGNOSIS — E871 Hypo-osmolality and hyponatremia: Secondary | ICD-10-CM | POA: Diagnosis not present

## 2022-10-25 DIAGNOSIS — R8290 Unspecified abnormal findings in urine: Secondary | ICD-10-CM | POA: Diagnosis not present

## 2022-10-25 DIAGNOSIS — Z0000 Encounter for general adult medical examination without abnormal findings: Secondary | ICD-10-CM | POA: Diagnosis not present

## 2022-10-25 DIAGNOSIS — N3946 Mixed incontinence: Secondary | ICD-10-CM | POA: Diagnosis not present

## 2022-10-25 DIAGNOSIS — M19041 Primary osteoarthritis, right hand: Secondary | ICD-10-CM | POA: Diagnosis not present

## 2022-10-25 DIAGNOSIS — M19042 Primary osteoarthritis, left hand: Secondary | ICD-10-CM | POA: Diagnosis not present

## 2022-10-25 DIAGNOSIS — M25559 Pain in unspecified hip: Secondary | ICD-10-CM | POA: Diagnosis not present

## 2022-11-05 DIAGNOSIS — M19041 Primary osteoarthritis, right hand: Secondary | ICD-10-CM | POA: Diagnosis not present

## 2022-11-05 DIAGNOSIS — F411 Generalized anxiety disorder: Secondary | ICD-10-CM | POA: Diagnosis not present

## 2022-11-05 DIAGNOSIS — M19042 Primary osteoarthritis, left hand: Secondary | ICD-10-CM | POA: Diagnosis not present

## 2022-11-05 DIAGNOSIS — E871 Hypo-osmolality and hyponatremia: Secondary | ICD-10-CM | POA: Diagnosis not present

## 2022-11-05 DIAGNOSIS — N393 Stress incontinence (female) (male): Secondary | ICD-10-CM | POA: Diagnosis not present

## 2022-11-05 DIAGNOSIS — F330 Major depressive disorder, recurrent, mild: Secondary | ICD-10-CM | POA: Diagnosis not present

## 2022-11-05 DIAGNOSIS — Z0000 Encounter for general adult medical examination without abnormal findings: Secondary | ICD-10-CM | POA: Diagnosis not present

## 2022-11-20 ENCOUNTER — Encounter: Payer: Self-pay | Admitting: Oncology

## 2022-11-27 ENCOUNTER — Inpatient Hospital Stay: Payer: PPO | Attending: Oncology

## 2022-11-27 VITALS — BP 107/65 | HR 54 | Resp 20

## 2022-11-27 DIAGNOSIS — Z862 Personal history of diseases of the blood and blood-forming organs and certain disorders involving the immune mechanism: Secondary | ICD-10-CM

## 2023-01-21 ENCOUNTER — Encounter: Payer: Self-pay | Admitting: Oncology

## 2023-01-28 ENCOUNTER — Other Ambulatory Visit: Payer: Self-pay | Admitting: Internal Medicine

## 2023-01-28 DIAGNOSIS — Z1231 Encounter for screening mammogram for malignant neoplasm of breast: Secondary | ICD-10-CM

## 2023-01-29 ENCOUNTER — Inpatient Hospital Stay: Payer: PPO

## 2023-01-29 ENCOUNTER — Encounter: Payer: Self-pay | Admitting: Oncology

## 2023-01-29 ENCOUNTER — Inpatient Hospital Stay: Payer: PPO | Attending: Oncology | Admitting: Oncology

## 2023-01-29 DIAGNOSIS — Z862 Personal history of diseases of the blood and blood-forming organs and certain disorders involving the immune mechanism: Secondary | ICD-10-CM

## 2023-01-29 DIAGNOSIS — Z8052 Family history of malignant neoplasm of bladder: Secondary | ICD-10-CM | POA: Diagnosis not present

## 2023-01-29 NOTE — Progress Notes (Unsigned)
[facility]  Telephone:(336) [phone] Fax:(336) [phone]  ID: Woodroe Chen OB: [birth year]  MR#: [id number]  QIO#:[id number]  Patient Care Team: Barbette Reichmann, MD as PCP - General (Internal Medicine)   CHIEF COMPLAINT: Hereditary hemochromatosis.  INTERVAL HISTORY: Patient returns to clinic today for routine 46-month evaluation and continuation of phlebotomy.  She continues to feel well and remains asymptomatic.  She has no significant side effects since increasing her phlebotomy treatments to every 2 months. She has no neurologic complaints.  She denies any recent fevers or illnesses.  She has a good appetite and denies weight loss.  She denies any chest pain, shortness of breath, cough, or hemoptysis.  She denies any nausea, vomiting, or diarrhea.  She has no urinary complaints.  Patient offers no specific complaints today.  REVIEW OF SYSTEMS:   Review of Systems  Constitutional: Negative.  Negative for fever, malaise/fatigue and weight loss.  HENT: Negative.  Negative for congestion.   Respiratory: Negative.  Negative for shortness of breath.   Cardiovascular: Negative.  Negative for chest pain and leg swelling.  Gastrointestinal: Negative.  Negative for constipation, diarrhea, nausea and vomiting.  Genitourinary: Negative.  Negative for dysuria.  Musculoskeletal: Negative.  Negative for back pain.  Skin: Negative.  Negative for rash.  Neurological: Negative.  Negative for tingling, sensory change, weakness and headaches.  Psychiatric/Behavioral: Negative.  The patient is not nervous/anxious and does not have insomnia.     As per HPI. Otherwise, a complete review of systems is negative.  PAST MEDICAL HISTORY: Past Medical History:  Diagnosis Date   Anemia    Anxiety    Cataract cortical, senile    Chronic kidney disease    Colon polyp    Constipation    Depression    GERD (gastroesophageal reflux disease)    H/O hematuria    Hemochromatosis     Hyperlipidemia    since 10/2013,  Dr. Thedore Mins has cleared her from meds and reported to her her levels are [facility].    Incontinence in female    Osteoarthritis of both hands    Osteopenia    Osteoporosis    Overactive bladder    Stress incontinence    Vaginal atrophy     PAST SURGICAL HISTORY: Past Surgical History:  Procedure Laterality Date   CATARACT EXTRACTION     COLONOSCOPY     COLONOSCOPY WITH PROPOFOL N/A 03/28/2015   Procedure: COLONOSCOPY WITH PROPOFOL;  Surgeon: Scot Jun, MD;  Location: [facility] ENDOSCOPY;  Service: Endoscopy;  Laterality: N/A;   ESOPHAGOGASTRODUODENOSCOPY     FLEXIBLE SIGMOIDOSCOPY     TONSILLECTOMY      FAMILY HISTORY Family History  Problem Relation Age of Onset   Bipolar disorder Father    Cancer Mother    Kidney disease Mother    Bladder Cancer Mother    Breast cancer Neg Hx        ADVANCED DIRECTIVES:    HEALTH MAINTENANCE: Social History   Tobacco Use   Smoking status: Never   Smokeless tobacco: Never  Vaping Use   Vaping Use: Never used  Substance Use Topics   Alcohol use: Not Currently    Alcohol/week: 1.0 standard drink of alcohol    Types: 1 Glasses of wine per week   Drug use: No     Colonoscopy:  PAP:  Bone density:  Lipid panel:  Allergies  Allergen Reactions   Nitrofurantoin Macrocrystal Hives    Current Outpatient Medications  Medication Sig Dispense Refill  Calcium-Vitamin D 600-200 MG-UNIT tablet Take 1 tablet by mouth 2 (two) times daily.     Cholecalciferol (VITAMIN D3) 5000 UNITS CAPS Take 1 capsule by mouth daily.     Cranberry 500 MG CAPS Take 1 capsule by mouth 1 day or 1 dose.     diphenhydramine-acetaminophen (TYLENOL PM) 25-500 MG TABS tablet Take by mouth at bedtime as needed (10mg ).     escitalopram (LEXAPRO) 5 MG tablet Take 10 mg by mouth daily.     Flaxseed, Linseed, (FLAXSEED OIL) 1000 MG CAPS Take 1 capsule by mouth 2 (two) times daily.      Multiple Vitamins-Minerals (PRESERVISION AREDS  2+MULTI VIT PO) PreserVision AREDS     polyethylene glycol (MIRALAX / GLYCOLAX) 17 g packet Take 17 g by mouth daily.     Probiotic Product (PROBIOTIC & ACIDOPHILUS EX ST PO) Take 1 capsule by mouth daily after supper.      Turmeric Curcumin 500 MG CAPS Take 1 capsule by mouth daily.     vitamin B-12 (CYANOCOBALAMIN) 500 MCG tablet Take 500 mcg by mouth daily.     Multiple Vitamin (MULTI-VITAMINS) TABS Take 1 tablet by mouth daily.  (Patient not taking: Reported on 01/29/2023)     Omega 3 1200 MG CAPS Take 1 capsule by mouth daily.     No current facility-administered medications for this visit.    OBJECTIVE: Vitals:   01/29/23 1336  BP: 130/68  Pulse: 76  Temp: (!) 97.1 F (36.2 C)  SpO2: 99%     Body mass index is 20.14 kg/m.    ECOG FS:0 - Asymptomatic  General: Well-developed, well-nourished, no acute distress. Eyes: Pink conjunctiva, anicteric sclera. HEENT: Normocephalic, moist mucous membranes. Lungs: No audible wheezing or coughing. Heart: Regular rate and rhythm. Abdomen: Soft, nontender, no obvious distention. Musculoskeletal: No edema, cyanosis, or clubbing. Neuro: Alert, answering all questions appropriately. Cranial nerves grossly intact. Skin: No rashes or petechiae noted. Psych: Normal affect.  LAB RESULTS:  Lab Results  Component Value Date   PROT 7.0 06/14/2014   ALBUMIN 4.0 06/14/2014   AST 17 06/14/2014   ALT 20 06/14/2014   ALKPHOS 83 06/14/2014   BILITOT 0.4 06/14/2014    Lab Results  Component Value Date   WBC 4.5 02/19/2018   NEUTROABS 2.8 02/19/2018   HGB 12.6 02/19/2018   HCT 36.6 02/19/2018   MCV 95.8 02/19/2018   PLT 336 02/19/2018     STUDIES: No results found.  ASSESSMENT: Hereditary hemochromatosis, by report heterozygote.  PLAN:    1. Hereditary hemochromatosis: Patient now receives her laboratory work at outside facility.  Her most recent laboratory work on January 12, 2022 revealed a total iron of 188 which is decreased from  200 and iron saturation of 73% which is decreased from 90%.  Ferritin has decreased from 133-96.  Continue 200 mL phlebotomy every 2 months.  Proceed with treatment today.  Return to clinic in 2 and 4 months for treatment only.  Patient will then return to clinic in 6 months with repeat laboratory work, further evaluation, and continuation of treatment.    I spent a total of 30 minutes reviewing chart data, face-to-face evaluation with the patient, counseling and coordination of care as detailed above.    Patient expressed understanding and was in agreement with this plan. She also understands that She can call clinic at any time with any questions, concerns, or complaints.    Jeralyn Ruths, MD 01/29/23 1:41 PM

## 2023-01-29 NOTE — Patient Instructions (Signed)

## 2023-01-30 ENCOUNTER — Encounter: Payer: Self-pay | Admitting: Oncology

## 2023-02-19 DIAGNOSIS — H35372 Puckering of macula, left eye: Secondary | ICD-10-CM | POA: Diagnosis not present

## 2023-02-19 DIAGNOSIS — H26491 Other secondary cataract, right eye: Secondary | ICD-10-CM | POA: Diagnosis not present

## 2023-02-19 DIAGNOSIS — H40053 Ocular hypertension, bilateral: Secondary | ICD-10-CM | POA: Diagnosis not present

## 2023-02-20 DIAGNOSIS — H26491 Other secondary cataract, right eye: Secondary | ICD-10-CM | POA: Diagnosis not present

## 2023-03-06 DIAGNOSIS — D225 Melanocytic nevi of trunk: Secondary | ICD-10-CM | POA: Diagnosis not present

## 2023-03-06 DIAGNOSIS — D2272 Melanocytic nevi of left lower limb, including hip: Secondary | ICD-10-CM | POA: Diagnosis not present

## 2023-03-06 DIAGNOSIS — L821 Other seborrheic keratosis: Secondary | ICD-10-CM | POA: Diagnosis not present

## 2023-03-06 DIAGNOSIS — D2271 Melanocytic nevi of right lower limb, including hip: Secondary | ICD-10-CM | POA: Diagnosis not present

## 2023-03-06 DIAGNOSIS — L570 Actinic keratosis: Secondary | ICD-10-CM | POA: Diagnosis not present

## 2023-03-06 DIAGNOSIS — D2262 Melanocytic nevi of left upper limb, including shoulder: Secondary | ICD-10-CM | POA: Diagnosis not present

## 2023-03-06 DIAGNOSIS — D2261 Melanocytic nevi of right upper limb, including shoulder: Secondary | ICD-10-CM | POA: Diagnosis not present

## 2023-03-06 DIAGNOSIS — H26492 Other secondary cataract, left eye: Secondary | ICD-10-CM | POA: Diagnosis not present

## 2023-03-14 ENCOUNTER — Ambulatory Visit
Admission: RE | Admit: 2023-03-14 | Discharge: 2023-03-14 | Disposition: A | Discharge status: Home / Self Care | Payer: PPO | Source: Ambulatory Visit | Attending: Internal Medicine | Admitting: Internal Medicine

## 2023-03-14 DIAGNOSIS — Z1231 Encounter for screening mammogram for malignant neoplasm of breast: Secondary | ICD-10-CM | POA: Insufficient documentation

## 2023-03-19 DIAGNOSIS — H40053 Ocular hypertension, bilateral: Secondary | ICD-10-CM | POA: Diagnosis not present

## 2023-04-01 ENCOUNTER — Inpatient Hospital Stay: Payer: PPO | Attending: Oncology

## 2023-04-25 DIAGNOSIS — M19042 Primary osteoarthritis, left hand: Secondary | ICD-10-CM | POA: Diagnosis not present

## 2023-04-25 DIAGNOSIS — F411 Generalized anxiety disorder: Secondary | ICD-10-CM | POA: Diagnosis not present

## 2023-04-25 DIAGNOSIS — M19041 Primary osteoarthritis, right hand: Secondary | ICD-10-CM | POA: Diagnosis not present

## 2023-04-25 DIAGNOSIS — N393 Stress incontinence (female) (male): Secondary | ICD-10-CM | POA: Diagnosis not present

## 2023-04-25 DIAGNOSIS — M19049 Primary osteoarthritis, unspecified hand: Secondary | ICD-10-CM | POA: Diagnosis not present

## 2023-04-25 DIAGNOSIS — E871 Hypo-osmolality and hyponatremia: Secondary | ICD-10-CM | POA: Diagnosis not present

## 2023-04-25 DIAGNOSIS — E559 Vitamin D deficiency, unspecified: Secondary | ICD-10-CM | POA: Diagnosis not present

## 2023-04-25 DIAGNOSIS — Z0000 Encounter for general adult medical examination without abnormal findings: Secondary | ICD-10-CM | POA: Diagnosis not present

## 2023-04-25 DIAGNOSIS — E119 Type 2 diabetes mellitus without complications: Secondary | ICD-10-CM | POA: Diagnosis not present

## 2023-04-25 DIAGNOSIS — I10 Essential (primary) hypertension: Secondary | ICD-10-CM | POA: Diagnosis not present

## 2023-05-06 DIAGNOSIS — I700 Atherosclerosis of aorta: Secondary | ICD-10-CM | POA: Diagnosis not present

## 2023-05-06 DIAGNOSIS — R3129 Other microscopic hematuria: Secondary | ICD-10-CM | POA: Diagnosis not present

## 2023-05-06 DIAGNOSIS — H9193 Unspecified hearing loss, bilateral: Secondary | ICD-10-CM | POA: Insufficient documentation

## 2023-05-06 DIAGNOSIS — F411 Generalized anxiety disorder: Secondary | ICD-10-CM | POA: Diagnosis not present

## 2023-05-06 DIAGNOSIS — M19041 Primary osteoarthritis, right hand: Secondary | ICD-10-CM | POA: Diagnosis not present

## 2023-05-06 DIAGNOSIS — E559 Vitamin D deficiency, unspecified: Secondary | ICD-10-CM | POA: Diagnosis not present

## 2023-05-06 DIAGNOSIS — F330 Major depressive disorder, recurrent, mild: Secondary | ICD-10-CM | POA: Diagnosis not present

## 2023-05-06 DIAGNOSIS — E871 Hypo-osmolality and hyponatremia: Secondary | ICD-10-CM | POA: Diagnosis not present

## 2023-05-06 DIAGNOSIS — M19042 Primary osteoarthritis, left hand: Secondary | ICD-10-CM | POA: Diagnosis not present

## 2023-05-06 DIAGNOSIS — H35372 Puckering of macula, left eye: Secondary | ICD-10-CM | POA: Diagnosis not present

## 2023-05-31 ENCOUNTER — Inpatient Hospital Stay: Payer: PPO | Attending: Oncology

## 2023-05-31 VITALS — BP 125/70 | HR 68 | Resp 20

## 2023-05-31 DIAGNOSIS — Z862 Personal history of diseases of the blood and blood-forming organs and certain disorders involving the immune mechanism: Secondary | ICD-10-CM

## 2023-05-31 NOTE — Patient Instructions (Signed)

## 2023-07-09 DIAGNOSIS — H52223 Regular astigmatism, bilateral: Secondary | ICD-10-CM | POA: Diagnosis not present

## 2023-07-09 DIAGNOSIS — H04123 Dry eye syndrome of bilateral lacrimal glands: Secondary | ICD-10-CM | POA: Diagnosis not present

## 2023-07-09 DIAGNOSIS — H401122 Primary open-angle glaucoma, left eye, moderate stage: Secondary | ICD-10-CM | POA: Diagnosis not present

## 2023-07-09 DIAGNOSIS — H5213 Myopia, bilateral: Secondary | ICD-10-CM | POA: Diagnosis not present

## 2023-07-09 DIAGNOSIS — H401111 Primary open-angle glaucoma, right eye, mild stage: Secondary | ICD-10-CM | POA: Diagnosis not present

## 2023-07-11 ENCOUNTER — Encounter: Payer: Self-pay | Admitting: Oncology

## 2023-07-26 DIAGNOSIS — H401122 Primary open-angle glaucoma, left eye, moderate stage: Secondary | ICD-10-CM | POA: Diagnosis not present

## 2023-07-29 ENCOUNTER — Encounter: Payer: Self-pay | Admitting: Oncology

## 2023-08-01 ENCOUNTER — Inpatient Hospital Stay: Payer: PPO | Attending: Oncology | Admitting: Oncology

## 2023-08-01 ENCOUNTER — Encounter: Payer: Self-pay | Admitting: Oncology

## 2023-08-01 ENCOUNTER — Inpatient Hospital Stay: Payer: PPO

## 2023-08-01 VITALS — BP 129/69 | HR 57 | Resp 20

## 2023-08-01 DIAGNOSIS — Z8052 Family history of malignant neoplasm of bladder: Secondary | ICD-10-CM | POA: Insufficient documentation

## 2023-08-01 DIAGNOSIS — Z862 Personal history of diseases of the blood and blood-forming organs and certain disorders involving the immune mechanism: Secondary | ICD-10-CM

## 2023-08-01 NOTE — Patient Instructions (Signed)

## 2023-08-01 NOTE — Progress Notes (Signed)
Would like her heart listened to was told she has irregular heart beat.

## 2023-08-02 ENCOUNTER — Encounter: Payer: Self-pay | Admitting: Oncology

## 2023-08-09 DIAGNOSIS — H04123 Dry eye syndrome of bilateral lacrimal glands: Secondary | ICD-10-CM | POA: Diagnosis not present

## 2023-08-09 DIAGNOSIS — H5213 Myopia, bilateral: Secondary | ICD-10-CM | POA: Diagnosis not present

## 2023-08-09 DIAGNOSIS — H401111 Primary open-angle glaucoma, right eye, mild stage: Secondary | ICD-10-CM | POA: Diagnosis not present

## 2023-08-09 DIAGNOSIS — H401122 Primary open-angle glaucoma, left eye, moderate stage: Secondary | ICD-10-CM | POA: Diagnosis not present

## 2023-08-09 DIAGNOSIS — Z9889 Other specified postprocedural states: Secondary | ICD-10-CM | POA: Diagnosis not present

## 2023-08-09 DIAGNOSIS — H52223 Regular astigmatism, bilateral: Secondary | ICD-10-CM | POA: Diagnosis not present

## 2023-08-27 IMAGING — MG MM DIGITAL SCREENING BILAT W/ TOMO AND CAD
6 of 10 series · 6 of 30 positions shown · non-contrast
Comparison: Previous exam(s).

CLINICAL DATA: Screening.

EXAM:
DIGITAL SCREENING BILATERAL MAMMOGRAM WITH TOMOSYNTHESIS AND CAD
TECHNIQUE: Bilateral screening digital craniocaudal and mediolateral oblique
mammograms were obtained. Bilateral screening digital breast
tomosynthesis was performed. The images were evaluated with
computer-aided detection.

[L CC synth-2D]
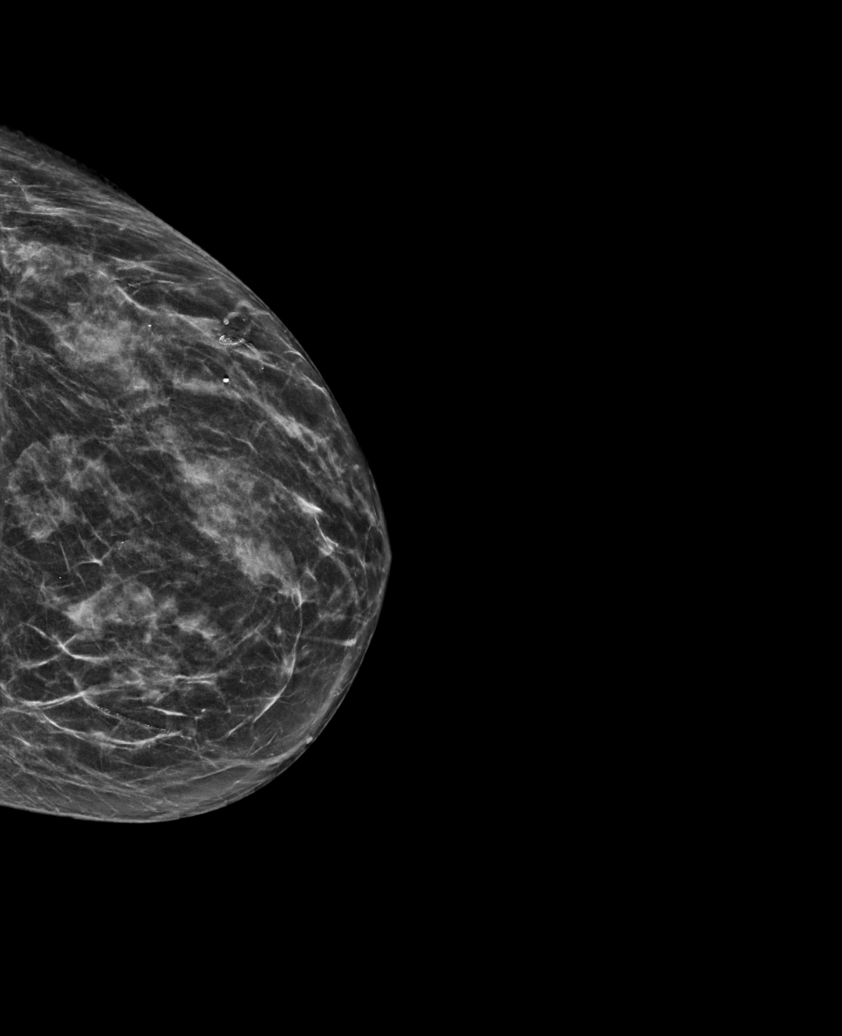

[R CC synth-2D]
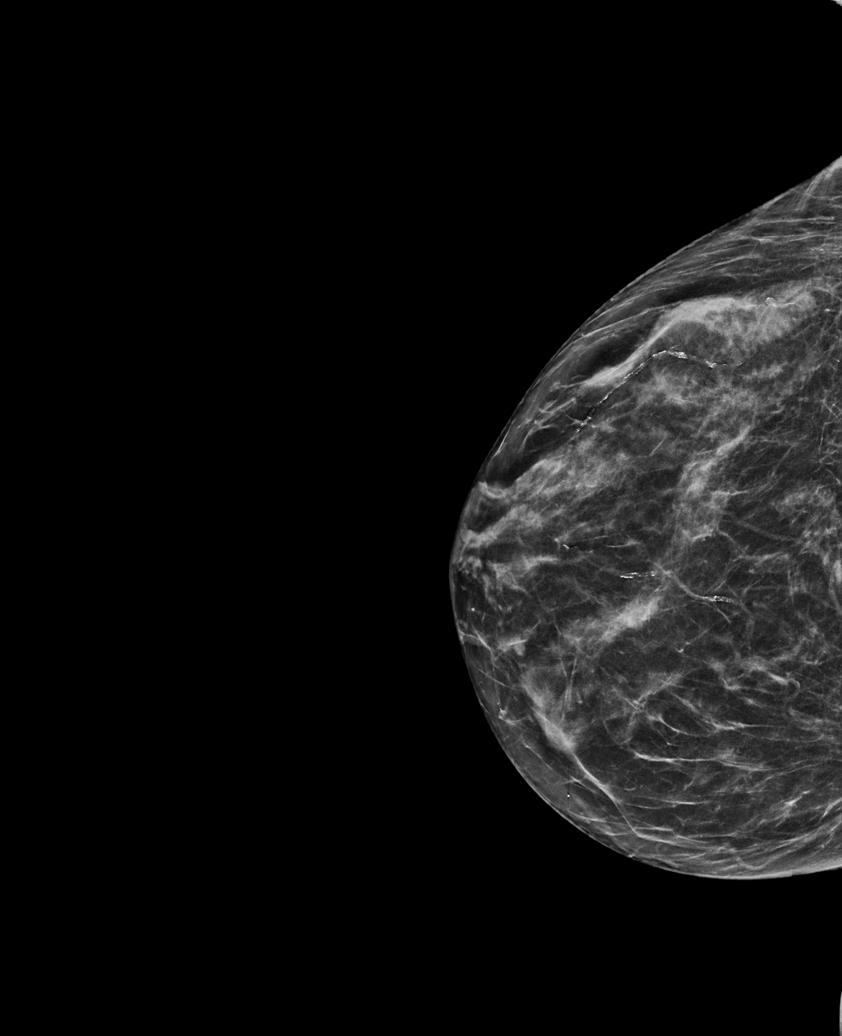

[R CV synth-2D]
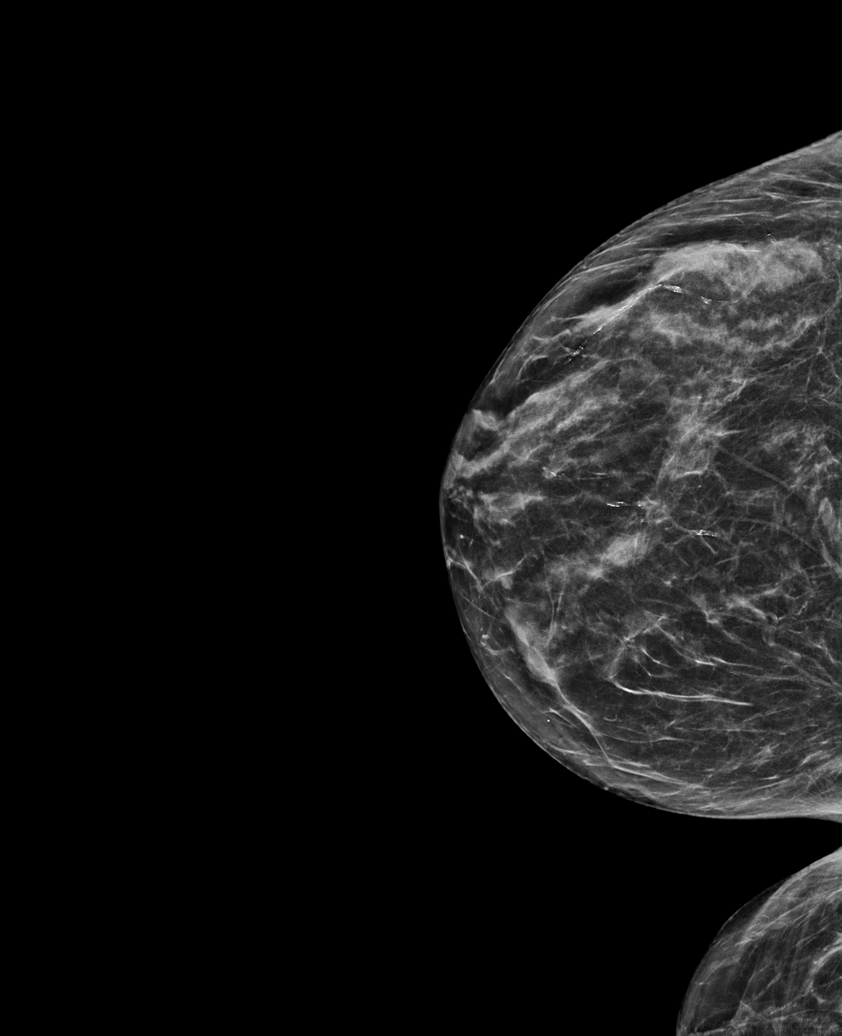

[L MLO synth-2D]
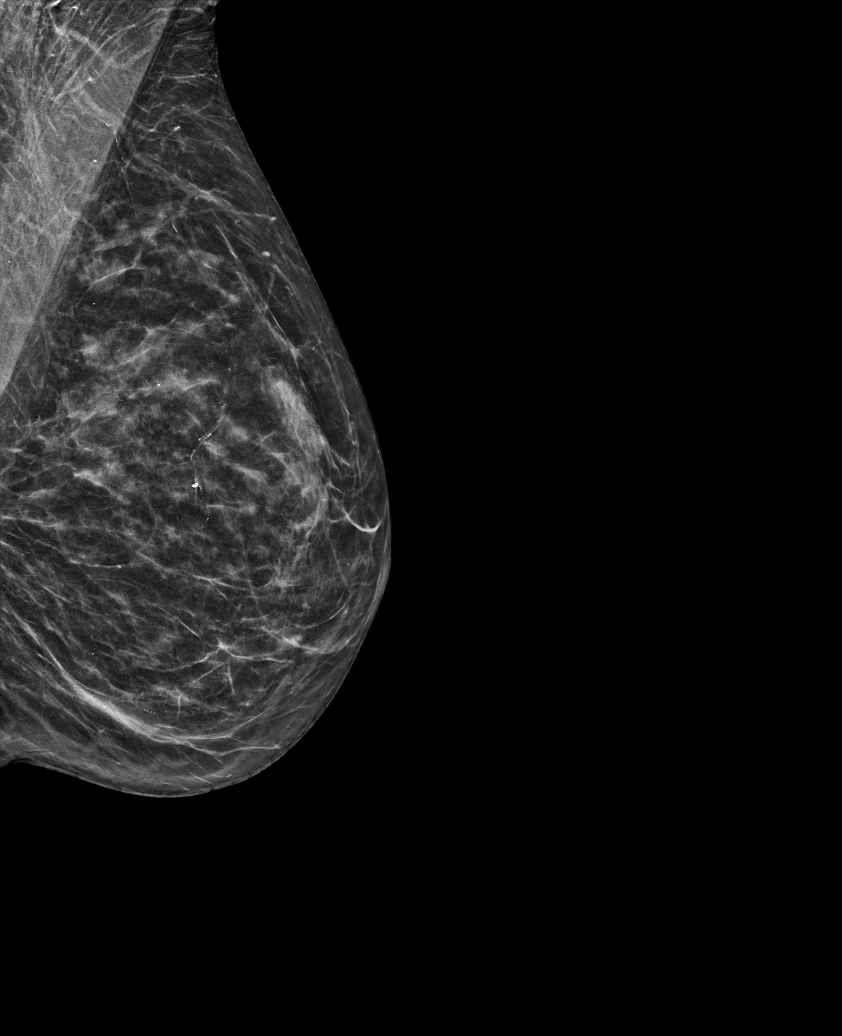

[R MLO synth-2D]
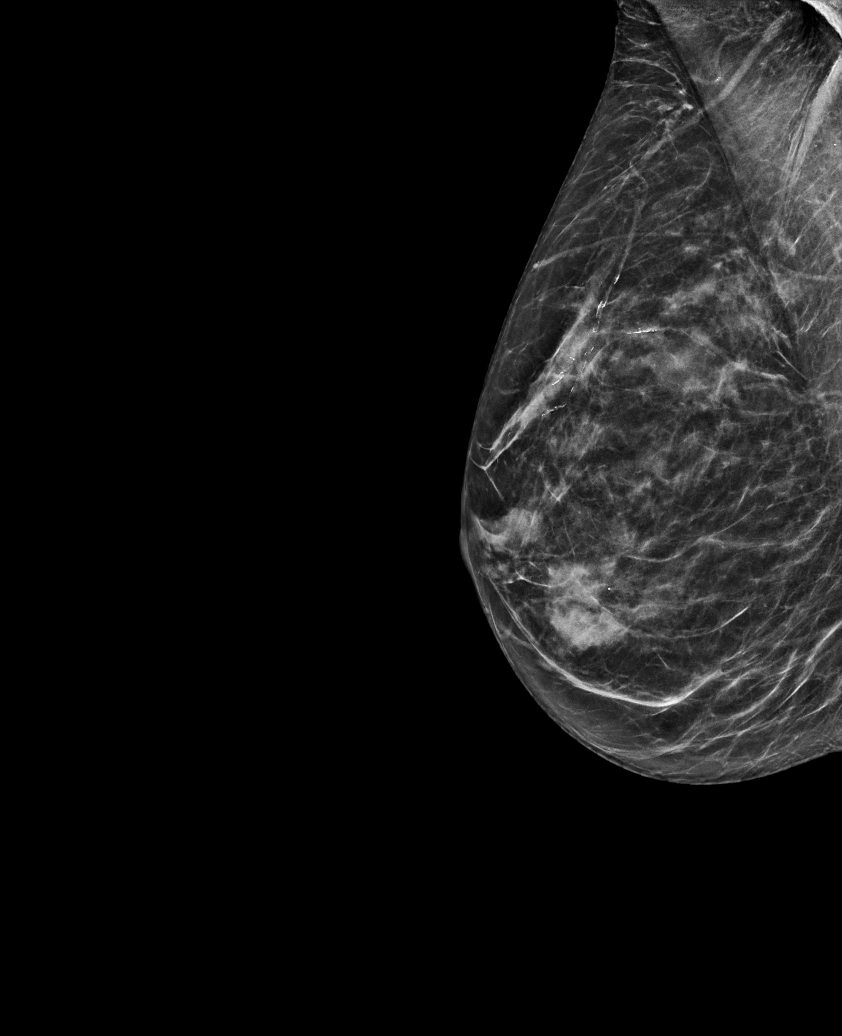

[R CC tomo · tomo slice 29/58.0]
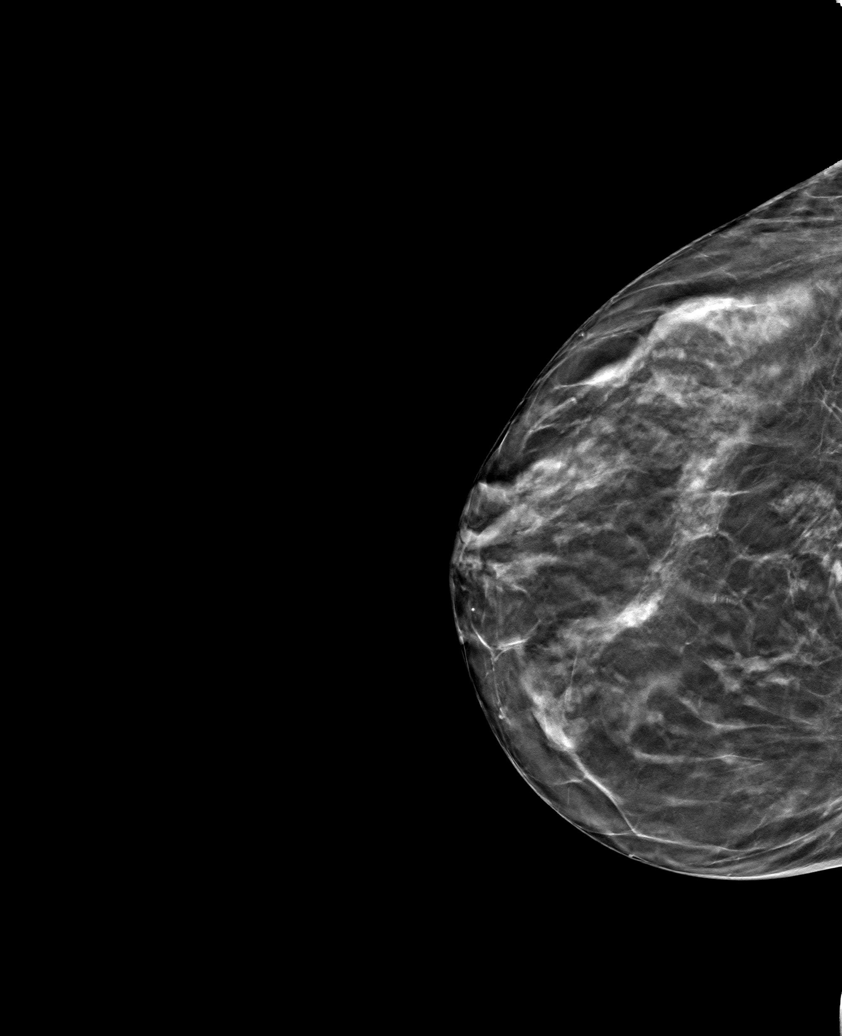

[6 of 30 positions shown; findings below may reference images not displayed]

ACR Breast Density Category c: The breast tissue is heterogeneously
dense, which may obscure small masses.
FINDINGS: There are no findings suspicious for malignancy.
IMPRESSION: No mammographic evidence of malignancy. A result letter of this
screening mammogram will be mailed directly to the patient.

RECOMMENDATION:
Screening mammogram in one year. (Code:Q3-[id number])

BI-RADS CATEGORY  1: Negative.

## 2023-08-28 DIAGNOSIS — M7062 Trochanteric bursitis, left hip: Secondary | ICD-10-CM | POA: Diagnosis not present

## 2023-08-28 DIAGNOSIS — M7061 Trochanteric bursitis, right hip: Secondary | ICD-10-CM | POA: Diagnosis not present

## 2023-10-01 ENCOUNTER — Inpatient Hospital Stay: Payer: PPO | Attending: Oncology

## 2023-10-01 VITALS — BP 118/62 | HR 67

## 2023-10-01 DIAGNOSIS — Z862 Personal history of diseases of the blood and blood-forming organs and certain disorders involving the immune mechanism: Secondary | ICD-10-CM

## 2023-10-28 DIAGNOSIS — M19041 Primary osteoarthritis, right hand: Secondary | ICD-10-CM | POA: Diagnosis not present

## 2023-10-28 DIAGNOSIS — H9193 Unspecified hearing loss, bilateral: Secondary | ICD-10-CM | POA: Diagnosis not present

## 2023-10-28 DIAGNOSIS — I700 Atherosclerosis of aorta: Secondary | ICD-10-CM | POA: Diagnosis not present

## 2023-10-28 DIAGNOSIS — H35372 Puckering of macula, left eye: Secondary | ICD-10-CM | POA: Diagnosis not present

## 2023-10-28 DIAGNOSIS — E559 Vitamin D deficiency, unspecified: Secondary | ICD-10-CM | POA: Diagnosis not present

## 2023-10-28 DIAGNOSIS — F330 Major depressive disorder, recurrent, mild: Secondary | ICD-10-CM | POA: Diagnosis not present

## 2023-10-28 DIAGNOSIS — F411 Generalized anxiety disorder: Secondary | ICD-10-CM | POA: Diagnosis not present

## 2023-10-28 DIAGNOSIS — E871 Hypo-osmolality and hyponatremia: Secondary | ICD-10-CM | POA: Diagnosis not present

## 2023-10-28 DIAGNOSIS — E440 Moderate protein-calorie malnutrition: Secondary | ICD-10-CM | POA: Diagnosis not present

## 2023-10-28 DIAGNOSIS — R3129 Other microscopic hematuria: Secondary | ICD-10-CM | POA: Diagnosis not present

## 2023-10-28 DIAGNOSIS — M19042 Primary osteoarthritis, left hand: Secondary | ICD-10-CM | POA: Diagnosis not present

## 2023-11-07 DIAGNOSIS — Z13820 Encounter for screening for osteoporosis: Secondary | ICD-10-CM | POA: Diagnosis not present

## 2023-11-07 DIAGNOSIS — R7989 Other specified abnormal findings of blood chemistry: Secondary | ICD-10-CM | POA: Diagnosis not present

## 2023-11-07 DIAGNOSIS — H509 Unspecified strabismus: Secondary | ICD-10-CM | POA: Diagnosis not present

## 2023-11-07 DIAGNOSIS — F330 Major depressive disorder, recurrent, mild: Secondary | ICD-10-CM | POA: Diagnosis not present

## 2023-11-07 DIAGNOSIS — Z780 Asymptomatic menopausal state: Secondary | ICD-10-CM | POA: Diagnosis not present

## 2023-11-07 DIAGNOSIS — M19042 Primary osteoarthritis, left hand: Secondary | ICD-10-CM | POA: Diagnosis not present

## 2023-11-07 DIAGNOSIS — M25551 Pain in right hip: Secondary | ICD-10-CM | POA: Diagnosis not present

## 2023-11-07 DIAGNOSIS — H35372 Puckering of macula, left eye: Secondary | ICD-10-CM | POA: Diagnosis not present

## 2023-11-07 DIAGNOSIS — F339 Major depressive disorder, recurrent, unspecified: Secondary | ICD-10-CM | POA: Diagnosis not present

## 2023-11-07 DIAGNOSIS — M19041 Primary osteoarthritis, right hand: Secondary | ICD-10-CM | POA: Diagnosis not present

## 2023-11-07 DIAGNOSIS — Z0000 Encounter for general adult medical examination without abnormal findings: Secondary | ICD-10-CM | POA: Diagnosis not present

## 2023-11-07 DIAGNOSIS — I700 Atherosclerosis of aorta: Secondary | ICD-10-CM | POA: Diagnosis not present

## 2023-11-14 DIAGNOSIS — M8588 Other specified disorders of bone density and structure, other site: Secondary | ICD-10-CM | POA: Diagnosis not present

## 2023-11-29 ENCOUNTER — Inpatient Hospital Stay: Payer: PPO | Attending: Oncology

## 2023-11-29 VITALS — BP 119/75 | HR 73

## 2023-11-29 DIAGNOSIS — Z862 Personal history of diseases of the blood and blood-forming organs and certain disorders involving the immune mechanism: Secondary | ICD-10-CM

## 2023-11-29 LAB — FERRITIN: Ferritin: 63 ng/mL (ref 11–307)

## 2023-11-29 NOTE — Patient Instructions (Signed)

## 2023-11-29 NOTE — Progress Notes (Signed)
 1300- MD notified last ferritin was Oct, Per MD draw ferritin today and proceed with Phlebotomy, no need to wait on results.   1330- Patient tolatered phlebotomy well performed in LAC using 20g angiocath. 200 cc removed as ordered. No concerns voiced. Snack and po fluids offered. Stable at discharge. AVS given.

## 2023-12-03 DIAGNOSIS — S62397A Other fracture of fifth metacarpal bone, left hand, initial encounter for closed fracture: Secondary | ICD-10-CM | POA: Diagnosis not present

## 2023-12-03 DIAGNOSIS — S6992XA Unspecified injury of left wrist, hand and finger(s), initial encounter: Secondary | ICD-10-CM | POA: Diagnosis not present

## 2023-12-04 ENCOUNTER — Encounter: Payer: Self-pay | Admitting: Oncology

## 2023-12-10 DIAGNOSIS — S60222A Contusion of left hand, initial encounter: Secondary | ICD-10-CM | POA: Diagnosis not present

## 2023-12-10 DIAGNOSIS — S62307P Unspecified fracture of fifth metacarpal bone, left hand, subsequent encounter for fracture with malunion: Secondary | ICD-10-CM | POA: Diagnosis not present

## 2023-12-10 DIAGNOSIS — S62309A Unspecified fracture of unspecified metacarpal bone, initial encounter for closed fracture: Secondary | ICD-10-CM | POA: Insufficient documentation

## 2024-01-23 DIAGNOSIS — M160 Bilateral primary osteoarthritis of hip: Secondary | ICD-10-CM | POA: Diagnosis not present

## 2024-01-27 ENCOUNTER — Encounter: Payer: Self-pay | Admitting: Oncology

## 2024-01-29 ENCOUNTER — Other Ambulatory Visit: Payer: Self-pay | Admitting: Internal Medicine

## 2024-01-29 DIAGNOSIS — Z1231 Encounter for screening mammogram for malignant neoplasm of breast: Secondary | ICD-10-CM

## 2024-01-29 DIAGNOSIS — R2689 Other abnormalities of gait and mobility: Secondary | ICD-10-CM | POA: Diagnosis not present

## 2024-01-29 DIAGNOSIS — M6281 Muscle weakness (generalized): Secondary | ICD-10-CM | POA: Diagnosis not present

## 2024-01-29 DIAGNOSIS — M25552 Pain in left hip: Secondary | ICD-10-CM | POA: Diagnosis not present

## 2024-01-29 DIAGNOSIS — M25551 Pain in right hip: Secondary | ICD-10-CM | POA: Diagnosis not present

## 2024-01-29 DIAGNOSIS — M160 Bilateral primary osteoarthritis of hip: Secondary | ICD-10-CM | POA: Diagnosis not present

## 2024-01-30 ENCOUNTER — Inpatient Hospital Stay: Payer: PPO

## 2024-01-30 ENCOUNTER — Encounter: Payer: Self-pay | Admitting: Oncology

## 2024-01-30 ENCOUNTER — Inpatient Hospital Stay: Payer: PPO | Attending: Oncology | Admitting: Oncology

## 2024-01-30 VITALS — BP 143/74 | HR 75 | Resp 18

## 2024-01-30 DIAGNOSIS — Z862 Personal history of diseases of the blood and blood-forming organs and certain disorders involving the immune mechanism: Secondary | ICD-10-CM

## 2024-01-30 DIAGNOSIS — Z8052 Family history of malignant neoplasm of bladder: Secondary | ICD-10-CM | POA: Diagnosis not present

## 2024-01-30 NOTE — Progress Notes (Signed)
 [facility]  Telephone:(336) [phone] Fax:(336) [phone]  ID: Judith Kim OB: September 12, 1936  MR#: [id number]  QIO#:[id number]  Patient Care Team: Antonio Baumgarten, MD as PCP - General (Internal Medicine)   CHIEF COMPLAINT: Hereditary hemochromatosis.  INTERVAL HISTORY: Patient returns to clinic today for routine 34-month evaluation and continuation of phlebotomy.  She continues to feel well and remains asymptomatic.  She does not complain of any weakness or fatigue.  She has no neurologic complaints.  She denies any recent fevers or illnesses.  She has a good appetite and denies weight loss.  She denies any chest pain, shortness of breath, cough, or hemoptysis.  She denies any nausea, vomiting, or diarrhea.  She has no urinary complaints.  Patient offers no specific complaints today.  REVIEW OF SYSTEMS:   Review of Systems  Constitutional: Negative.  Negative for fever, malaise/fatigue and weight loss.  HENT: Negative.  Negative for congestion.   Respiratory: Negative.  Negative for shortness of breath.   Cardiovascular: Negative.  Negative for chest pain and leg swelling.  Gastrointestinal: Negative.  Negative for constipation, diarrhea, nausea and vomiting.  Genitourinary: Negative.  Negative for dysuria.  Musculoskeletal: Negative.  Negative for back pain.  Skin: Negative.  Negative for rash.  Neurological: Negative.  Negative for tingling, sensory change, weakness and headaches.  Psychiatric/Behavioral: Negative.  The patient is not nervous/anxious and does not have insomnia.     As per HPI. Otherwise, a complete review of systems is negative.  PAST MEDICAL HISTORY: Past Medical History:  Diagnosis Date   Anemia    Anxiety    Cataract cortical, senile    Chronic kidney disease    Colon polyp    Constipation    Depression    GERD (gastroesophageal reflux disease)    H/O hematuria    Hemochromatosis    Hyperlipidemia    since 10/2013,  Dr. Zelda Hickman has  cleared her from meds and reported to her her levels are [facility].    Incontinence in female    Osteoarthritis of both hands    Osteopenia    Osteoporosis    Overactive bladder    Stress incontinence    Vaginal atrophy     PAST SURGICAL HISTORY: Past Surgical History:  Procedure Laterality Date   CATARACT EXTRACTION     COLONOSCOPY     COLONOSCOPY WITH PROPOFOL  N/A 03/28/2015   Procedure: COLONOSCOPY WITH PROPOFOL ;  Surgeon: Cassie Click, MD;  Location: [facility] END[facility]COPY;  Service: Endoscopy;  Laterality: N/A;   ESOPHAGOGASTRODUODENOSCOPY     FLEXIBLE SIGMOIDOSCOPY     TONSILLECTOMY      FAMILY HISTORY Family History  Problem Relation Age of Onset   Bipolar disorder Father    Cancer Mother    Kidney disease Mother    Bladder Cancer Mother    Breast cancer Neg Hx        ADVANCED DIRECTIVES:    HEALTH MAINTENANCE: Social History   Tobacco Use   Smoking status: Never   Smokeless tobacco: Never  Vaping Use   Vaping status: Never Used  Substance Use Topics   Alcohol  use: Not Currently    Alcohol /week: 1.0 standard drink of alcohol     Types: 1 Glasses of wine per week   Drug use: No     Colonoscopy:  PAP:  Bone density:  Lipid panel:  Allergies  Allergen Reactions   Nitrofurantoin Macrocrystal Hives    Current Outpatient Medications  Medication Sig Dispense Refill   acetaminophen (TYLENOL 8 HOUR  ARTHRITIS PAIN) 650 MG CR tablet      Calcium-Magnesium-Vitamin D (CALCIUM 1200+D3 PO)      Calcium-Vitamin D 600-200 MG-UNIT tablet Take 1 tablet by mouth 2 (two) times daily.     Cholecalciferol (VITAMIN D3) 5000 UNITS CAPS Take 1 capsule by mouth daily.     Cranberry 500 MG CAPS Take 1 capsule by mouth 1 day or 1 dose.     diphenhydramine-acetaminophen (TYLENOL PM) 25-500 MG TABS tablet Take by mouth at bedtime as needed (10mg ).     escitalopram (LEXAPRO) 5 MG tablet Take 10 mg by mouth daily.     Flaxseed, Linseed, (FLAXSEED OIL) 1000 MG CAPS Take 1 capsule by  mouth 2 (two) times daily.      latanoprost (XALATAN) 0.005 % ophthalmic solution Place 1 drop into both eyes at bedtime.     meloxicam (MOBIC) 7.5 MG tablet Take 7.5 mg by mouth daily.     Multiple Vitamin (MULTI-VITAMINS) TABS Take 1 tablet by mouth daily.     Multiple Vitamins-Minerals (PRESERVISION AREDS 2+MULTI VIT PO) PreserVision AREDS     Omega 3 1200 MG CAPS Take 1 capsule by mouth daily.     polyethylene glycol (MIRALAX / GLYCOLAX) 17 g packet Take 17 g by mouth daily.     Probiotic Product (PROBIOTIC & ACIDOPHILUS EX ST PO) Take 1 capsule by mouth daily after supper.      Turmeric Curcumin 500 MG CAPS Take 1 capsule by mouth daily.     vitamin B-12 (CYANOCOBALAMIN ) 500 MCG tablet Take 500 mcg by mouth daily.     No current facility-administered medications for this visit.    OBJECTIVE: Vitals:   01/30/24 1355  BP: 138/85  Pulse: 72  Resp: 19  SpO2: 99%     Body mass index is 20.19 kg/m.    ECOG FS:0 - Asymptomatic  General: Well-developed, well-nourished, no acute distress. Eyes: Pink conjunctiva, anicteric sclera. HEENT: Normocephalic, moist mucous membranes. Lungs: No audible wheezing or coughing. Heart: Regular rate and rhythm. Abdomen: Soft, nontender, no obvious distention. Musculoskeletal: No edema, cyanosis, or clubbing. Neuro: Alert, answering all questions appropriately. Cranial nerves grossly intact. Skin: No rashes or petechiae noted. Psych: Normal affect.  LAB RESULTS:  Lab Results  Component Value Date   PROT 7.0 06/14/2014   ALBUMIN 4.0 06/14/2014   AST 17 06/14/2014   ALT 20 06/14/2014   ALKPHOS 83 06/14/2014   BILITOT 0.4 06/14/2014    Lab Results  Component Value Date   WBC 4.5 02/19/2018   NEUTROABS 2.8 02/19/2018   HGB 12.6 02/19/2018   HCT 36.6 02/19/2018   MCV 95.8 02/19/2018   PLT 336 02/19/2018     STUDIES: No results found.  ASSESSMENT: Hereditary hemochromatosis, by report heterozygote.  PLAN:    Hereditary  hemochromatosis: Patient now receives her laboratory work at outside facility.  Her most recent laboratory work revealed a hemoglobin of 13.1, ferritin of 61, total iron of 128, and a percent saturation of 52%.  These remain essentially stable.  Continue 200 mL phlebotomy every 2 months.  Proceed with treatment today.  Return to clinic in 2 and 4 months for treatment only.  Patient will then return to clinic in 6 months for further evaluation and continuation of treatment.  I spent a total of 20 minutes reviewing chart data, face-to-face evaluation with the patient, counseling and coordination of care as detailed above.  Patient expressed understanding and was in agreement with this plan. She also understands that She  can call clinic at any time with any questions, concerns, or complaints.    Shellie Dials, MD 01/30/24 2:06 PM

## 2024-01-30 NOTE — Progress Notes (Signed)
 Judith Kim presents today for phlebotomy per MD orders. Phlebotomy procedure started at 1435 and ended at 1445. 200 mls removed. Patient tolerated procedure well. IV needle removed intact.

## 2024-01-30 NOTE — Patient Instructions (Signed)

## 2024-01-31 DIAGNOSIS — M25552 Pain in left hip: Secondary | ICD-10-CM | POA: Diagnosis not present

## 2024-01-31 DIAGNOSIS — R2689 Other abnormalities of gait and mobility: Secondary | ICD-10-CM | POA: Diagnosis not present

## 2024-01-31 DIAGNOSIS — M160 Bilateral primary osteoarthritis of hip: Secondary | ICD-10-CM | POA: Diagnosis not present

## 2024-01-31 DIAGNOSIS — M6281 Muscle weakness (generalized): Secondary | ICD-10-CM | POA: Diagnosis not present

## 2024-01-31 DIAGNOSIS — M25551 Pain in right hip: Secondary | ICD-10-CM | POA: Diagnosis not present

## 2024-02-06 DIAGNOSIS — H401122 Primary open-angle glaucoma, left eye, moderate stage: Secondary | ICD-10-CM | POA: Diagnosis not present

## 2024-02-14 DIAGNOSIS — H401111 Primary open-angle glaucoma, right eye, mild stage: Secondary | ICD-10-CM | POA: Diagnosis not present

## 2024-02-14 DIAGNOSIS — Z961 Presence of intraocular lens: Secondary | ICD-10-CM | POA: Diagnosis not present

## 2024-02-14 DIAGNOSIS — Z9889 Other specified postprocedural states: Secondary | ICD-10-CM | POA: Diagnosis not present

## 2024-02-14 DIAGNOSIS — H401122 Primary open-angle glaucoma, left eye, moderate stage: Secondary | ICD-10-CM | POA: Diagnosis not present

## 2024-03-03 DIAGNOSIS — M25552 Pain in left hip: Secondary | ICD-10-CM | POA: Diagnosis not present

## 2024-03-03 DIAGNOSIS — M25551 Pain in right hip: Secondary | ICD-10-CM | POA: Diagnosis not present

## 2024-03-03 DIAGNOSIS — R2689 Other abnormalities of gait and mobility: Secondary | ICD-10-CM | POA: Diagnosis not present

## 2024-03-03 DIAGNOSIS — M6281 Muscle weakness (generalized): Secondary | ICD-10-CM | POA: Diagnosis not present

## 2024-03-03 DIAGNOSIS — M160 Bilateral primary osteoarthritis of hip: Secondary | ICD-10-CM | POA: Diagnosis not present

## 2024-03-11 DIAGNOSIS — L821 Other seborrheic keratosis: Secondary | ICD-10-CM | POA: Diagnosis not present

## 2024-03-11 DIAGNOSIS — D2271 Melanocytic nevi of right lower limb, including hip: Secondary | ICD-10-CM | POA: Diagnosis not present

## 2024-03-11 DIAGNOSIS — D2261 Melanocytic nevi of right upper limb, including shoulder: Secondary | ICD-10-CM | POA: Diagnosis not present

## 2024-03-11 DIAGNOSIS — Z08 Encounter for follow-up examination after completed treatment for malignant neoplasm: Secondary | ICD-10-CM | POA: Diagnosis not present

## 2024-03-11 DIAGNOSIS — D2262 Melanocytic nevi of left upper limb, including shoulder: Secondary | ICD-10-CM | POA: Diagnosis not present

## 2024-03-11 DIAGNOSIS — D2272 Melanocytic nevi of left lower limb, including hip: Secondary | ICD-10-CM | POA: Diagnosis not present

## 2024-03-11 DIAGNOSIS — D225 Melanocytic nevi of trunk: Secondary | ICD-10-CM | POA: Diagnosis not present

## 2024-03-11 DIAGNOSIS — L570 Actinic keratosis: Secondary | ICD-10-CM | POA: Diagnosis not present

## 2024-03-11 DIAGNOSIS — Z85828 Personal history of other malignant neoplasm of skin: Secondary | ICD-10-CM | POA: Diagnosis not present

## 2024-03-16 ENCOUNTER — Ambulatory Visit
Admission: RE | Admit: 2024-03-16 | Discharge: 2024-03-16 | Disposition: A | Discharge status: Home / Self Care | Source: Ambulatory Visit | Attending: Internal Medicine | Admitting: Internal Medicine

## 2024-03-16 DIAGNOSIS — Z1231 Encounter for screening mammogram for malignant neoplasm of breast: Secondary | ICD-10-CM | POA: Insufficient documentation

## 2024-03-26 DIAGNOSIS — M1611 Unilateral primary osteoarthritis, right hip: Principal | ICD-10-CM | POA: Insufficient documentation

## 2024-03-26 DIAGNOSIS — M25552 Pain in left hip: Secondary | ICD-10-CM | POA: Diagnosis not present

## 2024-03-26 DIAGNOSIS — M47816 Spondylosis without myelopathy or radiculopathy, lumbar region: Secondary | ICD-10-CM | POA: Diagnosis not present

## 2024-03-26 DIAGNOSIS — M25551 Pain in right hip: Secondary | ICD-10-CM | POA: Diagnosis not present

## 2024-03-26 DIAGNOSIS — M160 Bilateral primary osteoarthritis of hip: Secondary | ICD-10-CM | POA: Diagnosis not present

## 2024-03-31 ENCOUNTER — Encounter: Payer: Self-pay | Admitting: Oncology

## 2024-03-31 ENCOUNTER — Inpatient Hospital Stay: Attending: Oncology

## 2024-03-31 VITALS — BP 124/79 | HR 61 | Temp 99.1°F | Resp 18

## 2024-03-31 DIAGNOSIS — Z862 Personal history of diseases of the blood and blood-forming organs and certain disorders involving the immune mechanism: Secondary | ICD-10-CM

## 2024-03-31 NOTE — Patient Instructions (Signed)

## 2024-03-31 NOTE — Progress Notes (Signed)
 Judith Kim presents today for phlebotomy per MD orders. Phlebotomy procedure started at 1412 and ended at 1420. 200 mls removed. Patient tolerated procedure well. IV needle removed intact.

## 2024-04-16 DIAGNOSIS — Z01818 Encounter for other preprocedural examination: Secondary | ICD-10-CM | POA: Diagnosis not present

## 2024-04-16 DIAGNOSIS — M1611 Unilateral primary osteoarthritis, right hip: Secondary | ICD-10-CM | POA: Diagnosis not present

## 2024-04-16 DIAGNOSIS — E871 Hypo-osmolality and hyponatremia: Secondary | ICD-10-CM | POA: Diagnosis not present

## 2024-04-16 DIAGNOSIS — Z23 Encounter for immunization: Secondary | ICD-10-CM | POA: Diagnosis not present

## 2024-04-16 DIAGNOSIS — H9193 Unspecified hearing loss, bilateral: Secondary | ICD-10-CM | POA: Diagnosis not present

## 2024-04-16 DIAGNOSIS — H35372 Puckering of macula, left eye: Secondary | ICD-10-CM | POA: Diagnosis not present

## 2024-04-16 DIAGNOSIS — F3340 Major depressive disorder, recurrent, in remission, unspecified: Secondary | ICD-10-CM | POA: Diagnosis not present

## 2024-04-16 DIAGNOSIS — R3129 Other microscopic hematuria: Secondary | ICD-10-CM | POA: Diagnosis not present

## 2024-04-16 DIAGNOSIS — I700 Atherosclerosis of aorta: Secondary | ICD-10-CM | POA: Diagnosis not present

## 2024-04-17 DIAGNOSIS — M1611 Unilateral primary osteoarthritis, right hip: Secondary | ICD-10-CM | POA: Diagnosis not present

## 2024-04-17 DIAGNOSIS — R3129 Other microscopic hematuria: Secondary | ICD-10-CM | POA: Diagnosis not present

## 2024-04-17 DIAGNOSIS — F3340 Major depressive disorder, recurrent, in remission, unspecified: Secondary | ICD-10-CM | POA: Diagnosis not present

## 2024-04-17 DIAGNOSIS — Z01818 Encounter for other preprocedural examination: Secondary | ICD-10-CM | POA: Diagnosis not present

## 2024-04-17 DIAGNOSIS — H9193 Unspecified hearing loss, bilateral: Secondary | ICD-10-CM | POA: Diagnosis not present

## 2024-04-17 DIAGNOSIS — E871 Hypo-osmolality and hyponatremia: Secondary | ICD-10-CM | POA: Diagnosis not present

## 2024-04-17 DIAGNOSIS — I700 Atherosclerosis of aorta: Secondary | ICD-10-CM | POA: Diagnosis not present

## 2024-04-20 ENCOUNTER — Encounter: Payer: Self-pay | Admitting: Oncology

## 2024-04-26 NOTE — Discharge Instructions (Signed)
 Instructions after Total Hip Replacement   Tanvi Gatling P. Angie Fava., M.D.    Dept. of Orthopaedics & Sports Medicine [facility] [address] [place], [place]  [address]  Phone: [phone]   Fax: [phone]        [email or web address]        DIET: Drink plenty of non-alcoholic fluids. Resume your normal diet. Include foods high in fiber.  ACTIVITY:  You may use crutches or a walker with weight-bearing as tolerated, unless instructed otherwise. You may be weaned off of the walker or crutches by your Physical Therapist.  Do NOT reach below the level of your knees or cross your legs until allowed.    Continue doing gentle exercises. Exercising will reduce the pain and swelling, increase motion, and prevent muscle weakness.   Please continue to use the TED compression stockings for 6 weeks. You may remove the stockings at night, but should reapply them in the morning. Do not drive or operate any equipment until instructed.  WOUND CARE:  Continue to use ice packs periodically to reduce pain and swelling. The initial dressing (Aquacel) can remain in place for 7 days (see separate instructions). Keep the incision clean and dry. You may bathe or shower after the staples are removed at the first office visit following surgery.  MEDICATIONS: You may resume your regular medications. Please take the pain medication as prescribed on the medication. Do not take pain medication on an empty stomach. Unless instructed otherwise, you should take an enteric-coated aspirin 81 mg. TWICE a day. (This along with elevation will help reduce the possibility of blood clots/phlebitis in your operated leg.) Use a stool softener (such as Senokot-S or Colace) daily and a laxative (such as Miralax or Dulcolax) as needed to prevent constipation.  Do not drive or drink alcoholic beverages when taking pain medications.  CALL THE OFFICE FOR: Temperature above 101 degrees Excessive bleeding or drainage  on the dressing. Excessive swelling, coldness, or paleness of the toes. Persistent nausea and vomiting.  FOLLOW-UP:  You should have an appointment to return to the office in 6 weeks after surgery. Arrangements have been made for continuation of Physical Therapy (either home therapy or outpatient therapy).     [facility] Department Directory         [email or web address]       [email or web address]          Cardiology  Appointments: [place] - [phone]  Endocrinology  Appointments: [place] [phone] [place] - [phone]  Gastroenterology  Appointments: [place] [phone] [place] - [phone]        General Surgery   Appointments: [facility]  Internal Medicine/Family Medicine  Appointments: [facility] [place] - [phone] [place] - [phone]  Metabolic and Weigh Loss Surgery  Appointments: [facility]        Neurology  Appointments: [place] [phone] [place] - [phone]  Neurosurgery  Appointments: [place]  Obstetrics & Gynecology  Appointments: [place] [phone] [place] - [phone]        Pediatrics  Appointments: Sherrie Sport [phone] [place] - [phone]  Physiatry  Appointments: [place] [phone]  Physical Therapy  Appointments: [place] - [phone]        Podiatry  Appointments: [place] [phone] [place] - [phone]  Pulmonology  Appointments: [place]  Rheumatology  Appointments: [place] [phone]        [place] Location: [facility]  [address] [place], [place]  [address]  [place] Sport  Location: [facility]. [address] [place], [place]  [address]  [place] Location: [facility] [address] [place], [place]  [address]

## 2024-04-28 DIAGNOSIS — M1611 Unilateral primary osteoarthritis, right hip: Secondary | ICD-10-CM | POA: Diagnosis not present

## 2024-04-29 NOTE — Patient Instructions (Signed)
 Your procedure is scheduled on:05-06-24 Wednesday Report to the Registration Desk on the 1st floor of the [facility].Then proceed to 2nd floor Surgery Desk To find out your arrival time, please call [phone] between 1PM - 3PM on:05-05-24 Tuesday If your arrival time is 6:00 am, do not arrive before that time as the Medical Mall entrance doors do not open until 6:00 am.  REMEMBER: Instructions that are not followed completely may result in serious medical risk, up to and including death; or upon the discretion of your surgeon and anesthesiologist your surgery may need to be rescheduled.  Do not eat food after midnight the night before surgery.  No gum chewing or hard candies.  You may however, drink CLEAR liquids up to 2 hours before you are scheduled to arrive for your surgery. Do not drink anything within 2 hours of your scheduled arrival time.  Clear liquids include: - water  - apple juice without pulp - gatorade (not RED colors) - black coffee or tea (Do NOT add milk or creamers to the coffee or tea) Do NOT drink anything that is not on this list.  In addition, your doctor has ordered for you to drink the provided:  Ensure Pre-Surgery Clear Carbohydrate Drink  Drinking this carbohydrate drink up to two hours before surgery helps to reduce insulin resistance and improve patient outcomes. Please complete drinking 2 hours before scheduled arrival time.  One week prior to surgery:Stop NOW (04-30-24) Stop Anti-inflammatories (NSAIDS) such as meloxicam (MOBIC), Advil, Aleve, Ibuprofen, Motrin, Naproxen, Naprosyn and Aspirin based products such as Excedrin, Goody's Powder, BC Powder. Stop ANY OVER THE COUNTER supplements until after surgery (Calcium + D, Cranberry, Flaxseed Oil, Multivitamin, Preservision Areds, Omega 3, Probiotic, Turmeric, Vitamin B12)  You may however, continue to take Tylenol if needed for pain up until the day of surgery.  Continue taking all of your other  prescription medications up until the day of surgery.  ON THE DAY OF SURGERY ONLY TAKE THESE MEDICATIONS WITH SIPS OF WATER: -escitalopram (LEXAPRO)   No Alcohol  for 24 hours before or after surgery.  No Smoking including e-cigarettes for 24 hours before surgery.  No chewable tobacco products for at least 6 hours before surgery.  No nicotine patches on the day of surgery.  Do not use any recreational drugs for at least a week (preferably 2 weeks) before your surgery.  Please be advised that the combination of cocaine and anesthesia may have negative outcomes, up to and including death. If you test positive for cocaine, your surgery will be cancelled.  On the morning of surgery brush your teeth with toothpaste and water, you may rinse your mouth with mouthwash if you wish. Do not swallow any toothpaste or mouthwash.  Use CHG Soap as directed on instruction sheet.  Do not wear jewelry, make-up, hairpins, clips or nail polish.  For welded (permanent) jewelry: bracelets, anklets, waist bands, etc.  Please have this removed prior to surgery.  If it is not removed, there is a chance that hospital personnel will need to cut it off on the day of surgery.  Do not wear lotions, powders, or perfumes.   Do not shave body hair from the neck down 48 hours before surgery.  Contact lenses, hearing aids and dentures may not be worn into surgery.  Do not bring valuables to the hospital. [facility] is not responsible for any missing/lost belongings or valuables.   Notify your doctor if there is any change in your medical condition (  cold, fever, infection).  Wear comfortable clothing (specific to your surgery type) to the hospital.  After surgery, you can help prevent lung complications by doing breathing exercises.  Take deep breaths and cough every 1-2 hours. Your doctor may order a device called an Incentive Spirometer to help you take deep breaths. When coughing or sneezing, hold a pillow  firmly against your incision with both hands. This is called "splinting." Doing this helps protect your incision. It also decreases belly discomfort.  If you are being admitted to the hospital overnight, leave your suitcase in the car. After surgery it may be brought to your room.  In case of increased patient census, it may be necessary for you, the patient, to continue your postoperative care in the Same Day Surgery department.  If you are being discharged the day of surgery, you will not be allowed to drive home. You will need a responsible individual to drive you home and stay with you for 24 hours after surgery.   If you are taking public transportation, you will need to have a responsible individual with you.  Please call the Pre-admissions Testing Dept. at [phone] if you have any questions about these instructions.  Surgery Visitation Policy:  Patients having surgery or a procedure may have two visitors.  Children under the age of 52 must have an adult with them who is not the patient.  Inpatient Visitation:    Visiting hours are 7 a.m. to 8 p.m. Up to four visitors are allowed at one time in a patient room. The visitors may rotate out with other people during the day.  One visitor age 78 or older may stay with the patient overnight and must be in the room by 8 p.m.    Pre-operative 5 CHG Bath Instructions   You can play a key role in reducing the risk of infection after surgery. Your skin needs to be as free of germs as possible. You can reduce the number of germs on your skin by washing with CHG (chlorhexidine gluconate) soap before surgery. CHG is an antiseptic soap that kills germs and continues to kill germs even after washing.   DO NOT use if you have an allergy to chlorhexidine/CHG or antibacterial soaps. If your skin becomes reddened or irritated, stop using the CHG and notify one of our RNs at [phone].   Please shower with the CHG soap starting 4 days  before surgery using the following schedule:     Please keep in mind the following:  DO NOT shave, including legs and underarms, starting the day of your first shower.   You may shave your face at any point before/day of surgery.  Place clean sheets on your bed the day you start using CHG soap. Use a clean washcloth (not used since being washed) for each shower. DO NOT sleep with pets once you start using the CHG.   CHG Shower Instructions:  If you choose to wash your hair and private area, wash first with your normal shampoo/soap.  After you use shampoo/soap, rinse your hair and body thoroughly to remove shampoo/soap residue.  Turn the water OFF and apply about 3 tablespoons (45 ml) of CHG soap to a CLEAN washcloth.  Apply CHG soap ONLY FROM YOUR NECK DOWN TO YOUR TOES (washing for 3-5 minutes)  DO NOT use CHG soap on face, private areas, open wounds, or sores.  Pay special attention to the area where your surgery is being performed.  If you are  having back surgery, having someone wash your back for you may be helpful. Wait 2 minutes after CHG soap is applied, then you may rinse off the CHG soap.  Pat dry with a clean towel  Put on clean clothes/pajamas   If you choose to wear lotion, please use ONLY the CHG-compatible lotions on the back of this paper.     Additional instructions for the day of surgery: DO NOT APPLY any lotions, deodorants, cologne, or perfumes.   Put on clean/comfortable clothes.  Brush your teeth.  Ask your nurse before applying any prescription medications to the skin.      CHG Compatible Lotions   Aveeno Moisturizing lotion  Cetaphil Moisturizing Cream  Cetaphil Moisturizing Lotion  Clairol Herbal Essence Moisturizing Lotion, Dry Skin  Clairol Herbal Essence Moisturizing Lotion, Extra Dry Skin  Clairol Herbal Essence Moisturizing Lotion, Normal Skin  Curel Age Defying Therapeutic Moisturizing Lotion with Alpha Hydroxy  Curel Extreme Care Body Lotion   Curel Soothing Hands Moisturizing Hand Lotion  Curel Therapeutic Moisturizing Cream, Fragrance-Free  Curel Therapeutic Moisturizing Lotion, Fragrance-Free  Curel Therapeutic Moisturizing Lotion, Original Formula  Eucerin Daily Replenishing Lotion  Eucerin Dry Skin Therapy Plus Alpha Hydroxy Crme  Eucerin Dry Skin Therapy Plus Alpha Hydroxy Lotion  Eucerin Original Crme  Eucerin Original Lotion  Eucerin Plus Crme Eucerin Plus Lotion  Eucerin TriLipid Replenishing Lotion  Keri Anti-Bacterial Hand Lotion  Keri Deep Conditioning Original Lotion Dry Skin Formula Softly Scented  Keri Deep Conditioning Original Lotion, Fragrance Free Sensitive Skin Formula  Keri Lotion Fast Absorbing Fragrance Free Sensitive Skin Formula  Keri Lotion Fast Absorbing Softly Scented Dry Skin Formula  Keri Original Lotion  Keri Skin Renewal Lotion Keri Silky Smooth Lotion  Keri Silky Smooth Sensitive Skin Lotion  Nivea Body Creamy Conditioning Oil  Nivea Body Extra Enriched Lotion  Nivea Body Original Lotion  Nivea Body Sheer Moisturizing Lotion Nivea Crme  Nivea Skin Firming Lotion  NutraDerm 30 Skin Lotion  NutraDerm Skin Lotion  NutraDerm Therapeutic Skin Cream  NutraDerm Therapeutic Skin Lotion  ProShield Protective Hand Cream  Provon moisturizing lotion  How to Use an Incentive Spirometer An incentive spirometer is a tool that measures how well you are filling your lungs with each breath. Learning to take long, deep breaths using this tool can help you keep your lungs clear and active. This may help to reverse or lessen your chance of developing breathing (pulmonary) problems, especially infection. You may be asked to use a spirometer: After a surgery. If you have a lung problem or a history of smoking. After a long period of time when you have been unable to move or be active. If the spirometer includes an indicator to show the highest number that you have reached, your health care provider or  respiratory therapist will help you set a goal. Keep a log of your progress as told by your health care provider. What are the risks? Breathing too quickly may cause dizziness or cause you to pass out. Take your time so you do not get dizzy or light-headed. If you are in pain, you may need to take pain medicine before doing incentive spirometry. It is harder to take a deep breath if you are having pain. How to use your incentive spirometer  Sit up on the edge of your bed or on a chair. Hold the incentive spirometer so that it is in an upright position. Before you use the spirometer, breathe out normally. Place the mouthpiece in your mouth.  Make sure your lips are closed tightly around it. Breathe in slowly and as deeply as you can through your mouth, causing the piston or the ball to rise toward the top of the chamber. Hold your breath for 3-5 seconds, or for as long as possible. If the spirometer includes a coach indicator, use this to guide you in breathing. Slow down your breathing if the indicator goes above the marked areas. Remove the mouthpiece from your mouth and breathe out normally. The piston or ball will return to the bottom of the chamber. Rest for a few seconds, then repeat the steps 10 or more times. Take your time and take a few normal breaths between deep breaths so that you do not get dizzy or light-headed. Do this every 1-2 hours when you are awake. If the spirometer includes a goal marker to show the highest number you have reached (best effort), use this as a goal to work toward during each repetition. After each set of 10 deep breaths, cough a few times. This will help to make sure that your lungs are clear. If you have an incision on your chest or abdomen from surgery, place a pillow or a rolled-up towel firmly against the incision when you cough. This can help to reduce pain while taking deep breaths and coughing. General tips When you are able to get out of bed: Walk  around often. Continue to take deep breaths and cough in order to clear your lungs. Keep using the incentive spirometer until your health care provider says it is okay to stop using it. If you have been in the hospital, you may be told to keep using the spirometer at home. Contact a health care provider if: You are having difficulty using the spirometer. You have trouble using the spirometer as often as instructed. Your pain medicine is not giving enough relief for you to use the spirometer as told. You have a fever. Get help right away if: You develop shortness of breath. You develop a cough with bloody mucus from the lungs. You have fluid or blood coming from an incision site after you cough. Summary An incentive spirometer is a tool that can help you learn to take long, deep breaths to keep your lungs clear and active. You may be asked to use a spirometer after a surgery, if you have a lung problem or a history of smoking, or if you have been inactive for a long period of time. Use your incentive spirometer as instructed every 1-2 hours while you are awake. If you have an incision on your chest or abdomen, place a pillow or a rolled-up towel firmly against your incision when you cough. This will help to reduce pain. Get help right away if you have shortness of breath, you cough up bloody mucus, or blood comes from your incision when you cough. This information is not intended to replace advice given to you by your health care provider. Make sure you discuss any questions you have with your health care provider. Document Revised: 07/26/2023 Document Reviewed: 07/26/2023 Elsevier Patient Education  2024 [employer]  Preoperative Educational Videos for Total Hip, Knee and Shoulder Replacements  To better prepare for surgery, please view our videos that explain the physical activity and discharge planning required to have the best surgical recovery at [facility].  [email or web address]  Questions? Call [phone] or email [email or web address]       Community Resource Directory to address health-related social needs:  [email or web address]

## 2024-04-30 ENCOUNTER — Encounter
Admission: RE | Admit: 2024-04-30 | Discharge: 2024-04-30 | Disposition: A | Discharge status: Home / Self Care | Source: Ambulatory Visit | Attending: Orthopedic Surgery | Admitting: Orthopedic Surgery

## 2024-04-30 ENCOUNTER — Other Ambulatory Visit: Payer: Self-pay

## 2024-04-30 VITALS — BP 122/72 | HR 67 | Resp 14 | Ht 66.0 in | Wt 119.2 lb

## 2024-04-30 DIAGNOSIS — M1611 Unilateral primary osteoarthritis, right hip: Secondary | ICD-10-CM | POA: Insufficient documentation

## 2024-04-30 DIAGNOSIS — Z01818 Encounter for other preprocedural examination: Secondary | ICD-10-CM | POA: Diagnosis not present

## 2024-04-30 DIAGNOSIS — E871 Hypo-osmolality and hyponatremia: Secondary | ICD-10-CM | POA: Diagnosis not present

## 2024-04-30 DIAGNOSIS — Z01812 Encounter for preprocedural laboratory examination: Secondary | ICD-10-CM

## 2024-04-30 HISTORY — DX: Do not resuscitate: Z66

## 2024-04-30 HISTORY — DX: Presence of external hearing-aid: Z97.4

## 2024-04-30 HISTORY — DX: Unilateral primary osteoarthritis, right hip: M16.11

## 2024-04-30 LAB — URINALYSIS, ROUTINE W REFLEX MICROSCOPIC
Bilirubin Urine: NEGATIVE
Glucose, UA: NEGATIVE mg/dL
Ketones, ur: NEGATIVE mg/dL
Leukocytes,Ua: NEGATIVE
Nitrite: NEGATIVE
Protein, ur: NEGATIVE mg/dL
Specific Gravity, Urine: 1.019 (ref 1.005–1.030)
Squamous Epithelial / HPF: 0 /HPF (ref 0–5)
pH: 5 (ref 5.0–8.0)

## 2024-04-30 LAB — BASIC METABOLIC PANEL WITH GFR
Anion gap: 10 (ref 5–15)
BUN: 18 mg/dL (ref 8–23)
CO2: 29 mmol/L (ref 22–32)
Calcium: 9.3 mg/dL (ref 8.9–10.3)
Chloride: 96 mmol/L — ABNORMAL LOW (ref 98–111)
Creatinine, Ser: 0.55 mg/dL (ref 0.44–1.00)
GFR, Estimated: >60 mL/min (ref >60)
Glucose, Bld: 90 mg/dL (ref 70–99)
Potassium: 4.1 mmol/L (ref 3.5–5.1)
Sodium: 135 mmol/L (ref 135–145)

## 2024-04-30 LAB — TYPE AND SCREEN
ABO/RH(D): O POS
Antibody Screen: NEGATIVE

## 2024-04-30 LAB — SURGICAL PCR SCREEN
MRSA, PCR: NEGATIVE
Staphylococcus aureus: NEGATIVE

## 2024-04-30 LAB — SEDIMENTATION RATE: Sed Rate: 7 mm/h (ref 0–30)

## 2024-04-30 LAB — C-REACTIVE PROTEIN: CRP: 0.5 mg/dL (ref <1.0)

## 2024-05-03 ENCOUNTER — Encounter: Payer: Self-pay | Admitting: Orthopedic Surgery

## 2024-05-03 NOTE — H&P (Signed)
 ORTHOPAEDIC HISTORY & PHYSICAL Drake Fonda [place], [place] - 04/28/2024 2:30 PM EDT Formatting of this note is different from the original. NAME: Judith Kim H&P Date: 04/28/2024 Procedure Date: 05/06/2024  Chief Complaint: right hip pain  HPI Judith Kim is a 88 y.o. female who has severe Right hip pain. Patient reports a longstanding history of right knee discomfort. Patient states that the pain localizes along the anterior aspect of her groin and is made worse with any prolonged weightbearing or standing. She does report also intermittent feelings of giving way of the right hip. She does report that her symptoms are made worse with any rotational movements of the hip as well. It greatly affects her ability to perform her ADLs and ambulate long distances as she would like. She is not utilizing any ambulatory aids at this juncture. She has failed conservative treatment including NSAIDs, physical therapy, activity modification and exercise programs. She has requested operative intervention for relief of her DJD symptoms. Patient denies having any previous cardiac or pulmonary issues. No previous DVTs or clots. No previous surgeries on this right hip. She is not a diabetic. Of note, patient is noted to have lower back issues with potential underlying radicular symptoms.  Social Hx: Patient states that she lives at [facility] assisted living alone. She states that she will be looking to go to rehab after surgery, and has already looked at [facility] and [facility] for potential bed placement. She denies any alcohol  use, illicit drug use, nicotine use or smoking.  Medications & Allergies Allergies: Allergies Allergen Reactions Macrobid [Nitrofurantoin Monohyd/M-Cryst] Hives Nitrofurantoin Rash Nitrofurantoin Macrocrystal Hives Nitrofurantoin Macrocrystalline Hives  Home Medicines: Current Outpatient Medications on File Prior to Visit Medication Sig Dispense Refill acetaminophen   (TYLENOL ) 650 MG ER tablet Take 650 mg by mouth every 8 (eight) hours as needed for Pain antiox #8/om3/dha/epa/lut/zeax (PRESERVISION AREDS 2, OMEGA-3, ORAL) Take by mouth calcium carbonate-vitamin D3 (CALTRATE 600+D) 600 mg(1,500mg ) -200 unit tablet Take by mouth (Patient not taking: Reported on 04/28/2024) calcium polycarbophil (FIBERCON) 625 mg tablet Take 625 mg by mouth 2 (two) times daily. (Patient not taking: Reported on 04/28/2024) CALCIUM-MAGNESIUM -VITAMIN D2 ORAL cholecalciferol, vitamin D3, (VITAMIN D3) 125 mcg (5,000 unit) tablet Take 5,000 Units by mouth once daily cyanocobalamin  (VITAMIN B12) 1000 MCG tablet Take 1,000 mcg by mouth once daily escitalopram  oxalate (LEXAPRO ) 10 MG tablet Take 1 tablet (10 mg total) by mouth once daily for 180 days 90 tablet 1 flaxseed 1,000 mg Cap Take 1 capsule by mouth 2 (two) times daily. ketorolac (ACULAR) 0.5 % ophthalmic solution Place 1 drop into both eyes 4 (four) times daily LACTOBACILLUS ACIDOPHILUS (PROBIOTIC ORAL) Take by mouth. latanoprost  (XALATAN ) 0.005 % ophthalmic solution Apply 1 drop to eye at bedtime meloxicam (MOBIC) 7.5 MG tablet Take 1 tablet (7.5 mg total) by mouth once daily as needed for Pain 90 tablet 1 multivit with minerals/lutein (MULTIVITAMIN 50 PLUS ORAL) Take 1 tablet by mouth once daily omega-3 fatty acids-fish oil 360-1,200 mg Cap Take 1 capsule by mouth 2 (two) times daily. polyethylene glycol (MIRALAX ) packet Take 17 g by mouth once daily Mix in 4-8ounces of fluid prior to taking. turmeric/turmeric ext/pepr ext (TURMERIC-TURMERIC EXT-PEPPER ORAL) Take 1 capsule by mouth once daily vitamin E acetate (VITAMIN E ORAL) Take 400 Units by mouth once daily  No current facility-administered medications on file prior to visit.  Medical / Surgical History  Past Medical History: Diagnosis Date Abdominal hernia Maybe Allergy seasonal Anemia Anxiety 08/2013 Seasonal Anxiety  state 08/04/2014 Aortic atherosclerosis ()  05/06/2023 Arthritis Bilateral hearing loss 05/06/2023 Cataract cortical, senile Surgery - ?2011 Chronic constipation Maybe take 2 stool softeners daily Chronic kidney disease ?2000 ? Usually lab shows blood trace in urine Colon polyp 02/08/2009 adenomatous Depression 11/2013 Seasonal GERD (gastroesophageal reflux disease) Glaucoma (increased eye pressure) H/O adenomatous polyp of colon 02/04/2015 H/O mammogram 11/08/2011 Hematuria Hemochromatosis Hemorrhoids History of bone density study 11/23/2013 Hyperlipidemia Hyponatremia 10/26/2017 Major depressive disorder, recurrent, mild () 10/30/2019 Osteopenia Osteoporosis, post-menopausal 01/2014 ?Fall resulting in pelvic fracture Pulmonary fibrosis (CMS/HHS-HCC) recent xray showed ILD Sinusitis, unspecified seasonal Sleep apnea broken sleep-up 3-4 times/night   Past Surgical History: Procedure Laterality Date COLONOSCOPY 09/20/1999 Normal Colon COLONOSCOPY 09/17/2002 Adenomatous Polyp COLONOSCOPY 03/28/2015 Adenomatous Polyps: CBF 03/2020 CATARACT EXTRACTION COLONOSCOPY 02/08/2009, 11/15/2003 PH Adenomatous Polyp: CBF 01/2015; Fx Pelvis 02/20/2014 & will postpone 1 yr per RTE (dw) COLONOSCOPY Dr. Viktoria EGD 11/15/2003, 09/17/2002 EYE SURGERY ?2004 Cataracts SIGMOIDOSCOPY Dr. Viktoria SIGMOIDOSCOPY FLEXIBLE 06/25/1996, 05/16/1993 TONSILLECTOMY UPPER GASTROINTESTINAL ENDOSCOPY Dr. Viktoria   Physical Exam  Ht:167.6 cm (5' 6) Wt:54.7 kg (120 lb 9.6 oz) BMI: Body mass index is 19.47 kg/m.  General/Constitutional: No apparent distress: well-nourished and well developed. Eyes: Pupils equal, round with synchronous movement. Lymphatic: No palpable adenopathy. Respiratory: Patient has good chest rise and fall with inspiration and expiration. All lung fields are clear to auscultation bilaterally. There is no Rales, rhonchi or wheezes appreciated. Cardiovascular: Upon auscultation there is a regular rate and rhythm  without any murmurs, rubs, gallops or heaves appreciated. There does not appear to be any swelling down the lower extremities. Posterior tibial pulses appreciated bilaterally, 2+. Integumentary: No impressive skin lesions present, except as noted in detailed exam. Neuro/Psych: Normal mood and affect, oriented to person, place and time. Musculoskeletal: see exam below  Right hip exam Right Hip:  Upon inspection of the patient's right hip there is not appear to be any noticeable deformity. No open abrasion or laceration. No swelling or erythema appreciated.  Pelvic tilt: Negative Limb lengths: Equal with the patient standing Soft tissue swelling: Negative Erythema: Negative Crepitance: Negative Tenderness: Greater trochanter is nontender to palpation. Mild to moderate pain is elicited by axial compression or extremes of rotation. Atrophy: Fair to limited hip flexor and abductor strength. Noticeable atrophy when comparing right quadricep to left quadricep musculature Range of Motion: EXT/FLEX: 0/90 ADD/ABD: -/20 IR/ER: 15/30  Patient is neurovascularly intact to all dermatomes extending down there Right lower extremity to all dermatomes. Posterior tibial pulses were appreciated, 2+.  Imaging Hip Imaging: None ordered today. Previous images from 03/26/2024 were reviewed. There is noticeable cartilage loss across the superior margins of the acetabulum and throughout the femoral acetabular cartilage space. Near bone-on-bone articulation noted. Osteophyte formation present. Subchondral sclerosis is appreciated. Initial wear signs along the cortical margins of the femoral head. No fractures, lytic lesions or signs of AVN appreciated.  Assesment and Plan Hip DJD  I have recommended that Judith Kim undergo right total hip replacement. Consents has been signed. The risks, benefits, prognosis and alternatives including but not limited to DVT, PE, infection, neurovascular injury, failure of  the procedure and death were explained to the patient and she is willing to proceed with surgery as described to her by myself. Plan will be for post operative admission of at least 1 midnight for pain control and PT. She will be managed with DVT prophylaxis, antibiotics preoperatively for 24 hours and aggressive in patient rehab.  Pre, intra and post op interventions were discussed.  Patient has good understanding  Medication Reconciliation was performed. Discussed cessation of NSAIDs, vitamins and supplements.  A total of 45 minutes was spent reviewing patient's charts, medical reconciliation, discussing/educating the patient about surgical interventions, and answering any questions provided by the patient.  JOSHUA DALLAS KOYANAGI, PA [facility] 04/28/2024  Electronically signed by KOYANAGI Fonda DALLAS, PA at 04/28/2024 10:31 PM EDT

## 2024-05-06 ENCOUNTER — Other Ambulatory Visit: Payer: Self-pay

## 2024-05-06 ENCOUNTER — Encounter: Payer: Self-pay | Admitting: Orthopedic Surgery

## 2024-05-06 ENCOUNTER — Ambulatory Visit: Admitting: Registered Nurse

## 2024-05-06 ENCOUNTER — Observation Stay
Admission: RE | Admit: 2024-05-06 | Discharge: 2024-05-07 | Disposition: A | Discharge status: Home / Self Care | Attending: Orthopedic Surgery | Admitting: Orthopedic Surgery

## 2024-05-06 ENCOUNTER — Observation Stay

## 2024-05-06 ENCOUNTER — Ambulatory Visit: Payer: Self-pay | Admitting: Urgent Care

## 2024-05-06 ENCOUNTER — Encounter
Admission: RE | Disposition: A | Discharge status: Home / Self Care | Payer: Self-pay | Source: Home / Self Care | Attending: Orthopedic Surgery

## 2024-05-06 DIAGNOSIS — Z96641 Presence of right artificial hip joint: Secondary | ICD-10-CM | POA: Insufficient documentation

## 2024-05-06 DIAGNOSIS — N189 Chronic kidney disease, unspecified: Secondary | ICD-10-CM | POA: Diagnosis not present

## 2024-05-06 DIAGNOSIS — M1611 Unilateral primary osteoarthritis, right hip: Principal | ICD-10-CM | POA: Insufficient documentation

## 2024-05-06 DIAGNOSIS — Z7982 Long term (current) use of aspirin: Secondary | ICD-10-CM | POA: Insufficient documentation

## 2024-05-06 DIAGNOSIS — M8580 Other specified disorders of bone density and structure, unspecified site: Secondary | ICD-10-CM | POA: Diagnosis not present

## 2024-05-06 DIAGNOSIS — R3129 Other microscopic hematuria: Secondary | ICD-10-CM

## 2024-05-06 DIAGNOSIS — E871 Hypo-osmolality and hyponatremia: Secondary | ICD-10-CM

## 2024-05-06 DIAGNOSIS — Z862 Personal history of diseases of the blood and blood-forming organs and certain disorders involving the immune mechanism: Secondary | ICD-10-CM

## 2024-05-06 HISTORY — PX: TOTAL HIP ARTHROPLASTY: SHX124

## 2024-05-06 LAB — ABO/RH: ABO/RH(D): O POS

## 2024-05-06 SURGERY — ARTHROPLASTY, HIP, TOTAL,POSTERIOR APPROACH
Anesthesia: Spinal | Site: Hip | Laterality: Right

## 2024-05-06 MED ORDER — BUPIVACAINE HCL (PF) 0.5 % IJ SOLN
INTRAMUSCULAR | Status: AC
Start: 2024-05-06 — End: 2024-05-06
  Filled 2024-05-06: qty 10

## 2024-05-06 MED ORDER — LATANOPROST 0.005 % OP SOLN
1.0000 [drp] | Freq: Every day | OPHTHALMIC | Status: DC
  Administered 2024-05-06: 1 [drp] via OPHTHALMIC
  Filled 2024-05-06: qty 2.5

## 2024-05-06 MED ORDER — ENSURE PRE-SURGERY PO LIQD
296.0000 mL | Freq: Once | ORAL | Status: AC
  Administered 2024-05-06: 296 mL via ORAL
  Filled 2024-05-06: qty 296

## 2024-05-06 MED ORDER — CEFAZOLIN SODIUM-DEXTROSE 2-4 GM/100ML-% IV SOLN
2.0000 g | INTRAVENOUS | Status: AC
  Administered 2024-05-06: 2 g via INTRAVENOUS

## 2024-05-06 MED ORDER — SENNOSIDES-DOCUSATE SODIUM 8.6-50 MG PO TABS
1.0000 | ORAL_TABLET | Freq: Two times a day (BID) | ORAL | Status: DC
  Administered 2024-05-06 – 2024-05-07 (×2): 1 via ORAL
  Filled 2024-05-06 (×2): qty 1

## 2024-05-06 MED ORDER — TRANEXAMIC ACID-NACL 1000-0.7 MG/100ML-% IV SOLN
INTRAVENOUS | Status: AC
Start: 2024-05-06 — End: 2024-05-06
  Filled 2024-05-06: qty 100

## 2024-05-06 MED ORDER — 0.9 % SODIUM CHLORIDE (POUR BTL) OPTIME
TOPICAL | Status: DC | PRN
  Administered 2024-05-06: 500 mL

## 2024-05-06 MED ORDER — PANTOPRAZOLE SODIUM 40 MG PO TBEC
40.0000 mg | DELAYED_RELEASE_TABLET | Freq: Two times a day (BID) | ORAL | Status: DC
  Administered 2024-05-06 – 2024-05-07 (×2): 40 mg via ORAL
  Filled 2024-05-06 (×2): qty 1

## 2024-05-06 MED ORDER — DIPHENHYDRAMINE HCL 12.5 MG/5ML PO ELIX: 12.5000 mg | ORAL_SOLUTION | ORAL | Status: DC | PRN

## 2024-05-06 MED ORDER — CEFAZOLIN SODIUM-DEXTROSE 2-4 GM/100ML-% IV SOLN
INTRAVENOUS | Status: AC
Start: 2024-05-06 — End: 2024-05-06
  Filled 2024-05-06: qty 100

## 2024-05-06 MED ORDER — ACETAMINOPHEN 10 MG/ML IV SOLN
INTRAVENOUS | Status: AC
Start: 2024-05-06 — End: 2024-05-06
  Filled 2024-05-06: qty 100

## 2024-05-06 MED ORDER — FENTANYL CITRATE (PF) 100 MCG/2ML IJ SOLN
INTRAMUSCULAR | Status: DC | PRN
  Administered 2024-05-06: 50 ug via INTRAVENOUS
  Administered 2024-05-06 (×2): 25 ug via INTRAVENOUS

## 2024-05-06 MED ORDER — DEXAMETHASONE SODIUM PHOSPHATE 10 MG/ML IJ SOLN
8.0000 mg | Freq: Once | INTRAMUSCULAR | Status: AC
  Administered 2024-05-06: 8 mg via INTRAVENOUS

## 2024-05-06 MED ORDER — SODIUM CHLORIDE 0.9 % IR SOLN
Status: DC | PRN
  Administered 2024-05-06: 3000 mL

## 2024-05-06 MED ORDER — ONDANSETRON HCL 4 MG/2ML IJ SOLN
INTRAMUSCULAR | Status: AC
  Filled 2024-05-06: qty 2

## 2024-05-06 MED ORDER — MAGNESIUM HYDROXIDE 400 MG/5ML PO SUSP
30.0000 mL | Freq: Every day | ORAL | Status: DC
  Filled 2024-05-06: qty 30

## 2024-05-06 MED ORDER — OXYCODONE HCL 5 MG PO TABS: 10.0000 mg | ORAL_TABLET | ORAL | Status: DC | PRN

## 2024-05-06 MED ORDER — PHENYLEPHRINE HCL-NACL 20-0.9 MG/250ML-% IV SOLN
INTRAVENOUS | Status: AC
  Filled 2024-05-06: qty 250

## 2024-05-06 MED ORDER — PHENYLEPHRINE HCL-NACL 20-0.9 MG/250ML-% IV SOLN
INTRAVENOUS | Status: DC | PRN
  Administered 2024-05-06: 30 ug/min via INTRAVENOUS

## 2024-05-06 MED ORDER — ACETAMINOPHEN 325 MG PO TABS: 325.0000 mg | ORAL_TABLET | Freq: Four times a day (QID) | ORAL | Status: DC | PRN

## 2024-05-06 MED ORDER — DEXAMETHASONE SODIUM PHOSPHATE 10 MG/ML IJ SOLN
INTRAMUSCULAR | Status: AC
  Filled 2024-05-06: qty 1

## 2024-05-06 MED ORDER — SURGIPHOR WOUND IRRIGATION SYSTEM - OPTIME: TOPICAL | Status: DC | PRN

## 2024-05-06 MED ORDER — CELECOXIB 200 MG PO CAPS
200.0000 mg | ORAL_CAPSULE | Freq: Two times a day (BID) | ORAL | Status: DC
  Administered 2024-05-06 – 2024-05-07 (×2): 200 mg via ORAL
  Filled 2024-05-06 (×2): qty 1

## 2024-05-06 MED ORDER — OXYCODONE HCL 5 MG PO TABS
5.0000 mg | ORAL_TABLET | ORAL | Status: DC | PRN
  Administered 2024-05-06: 5 mg via ORAL
  Filled 2024-05-06: qty 1

## 2024-05-06 MED ORDER — ORAL CARE MOUTH RINSE: 15.0000 mL | Freq: Once | OROMUCOSAL | Status: AC

## 2024-05-06 MED ORDER — ONDANSETRON HCL 4 MG PO TABS
4.0000 mg | ORAL_TABLET | Freq: Four times a day (QID) | ORAL | Status: DC | PRN
  Administered 2024-05-06: 4 mg via ORAL
  Filled 2024-05-06: qty 1

## 2024-05-06 MED ORDER — HYDROMORPHONE HCL 1 MG/ML IJ SOLN: 0.5000 mg | INTRAMUSCULAR | Status: DC | PRN

## 2024-05-06 MED ORDER — ONDANSETRON HCL 4 MG/2ML IJ SOLN
INTRAMUSCULAR | Status: DC | PRN
  Administered 2024-05-06: 4 mg via INTRAVENOUS

## 2024-05-06 MED ORDER — CELECOXIB 200 MG PO CAPS
400.0000 mg | ORAL_CAPSULE | Freq: Once | ORAL | Status: AC
  Administered 2024-05-06: 400 mg via ORAL

## 2024-05-06 MED ORDER — TRAMADOL HCL 50 MG PO TABS
50.0000 mg | ORAL_TABLET | ORAL | Status: DC | PRN
  Administered 2024-05-07: 50 mg via ORAL
  Filled 2024-05-06: qty 1

## 2024-05-06 MED ORDER — CEFAZOLIN SODIUM-DEXTROSE 2-4 GM/100ML-% IV SOLN
INTRAVENOUS | Status: AC
  Filled 2024-05-06: qty 100

## 2024-05-06 MED ORDER — GABAPENTIN 300 MG PO CAPS
ORAL_CAPSULE | ORAL | Status: AC
  Filled 2024-05-06: qty 1

## 2024-05-06 MED ORDER — TRANEXAMIC ACID-NACL 1000-0.7 MG/100ML-% IV SOLN
1000.0000 mg | INTRAVENOUS | Status: AC
  Administered 2024-05-06: 1000 mg via INTRAVENOUS

## 2024-05-06 MED ORDER — SODIUM CHLORIDE 0.9 % IV SOLN: INTRAVENOUS | Status: DC

## 2024-05-06 MED ORDER — GABAPENTIN 300 MG PO CAPS
300.0000 mg | ORAL_CAPSULE | Freq: Once | ORAL | Status: AC
  Administered 2024-05-06: 300 mg via ORAL

## 2024-05-06 MED ORDER — CELECOXIB 200 MG PO CAPS
ORAL_CAPSULE | ORAL | Status: AC
  Filled 2024-05-06: qty 2

## 2024-05-06 MED ORDER — ESCITALOPRAM OXALATE 10 MG PO TABS
10.0000 mg | ORAL_TABLET | Freq: Every day | ORAL | Status: DC
  Administered 2024-05-06: 10 mg via ORAL
  Filled 2024-05-06: qty 1

## 2024-05-06 MED ORDER — PHENOL 1.4 % MT LIQD: 1.0000 | OROMUCOSAL | Status: DC | PRN

## 2024-05-06 MED ORDER — PROPOFOL 10 MG/ML IV BOLUS
INTRAVENOUS | Status: AC
Start: 2024-05-06 — End: 2024-05-06
  Filled 2024-05-06: qty 20

## 2024-05-06 MED ORDER — FENTANYL CITRATE (PF) 100 MCG/2ML IJ SOLN: 25.0000 ug | INTRAMUSCULAR | Status: DC | PRN

## 2024-05-06 MED ORDER — PROPOFOL 500 MG/50ML IV EMUL
INTRAVENOUS | Status: DC | PRN
Start: 2024-05-06 — End: 2024-05-06
  Administered 2024-05-06: 20 mg via INTRAVENOUS
  Administered 2024-05-06: 40 mg via INTRAVENOUS
  Administered 2024-05-06: 20 ug/kg/min via INTRAVENOUS
  Administered 2024-05-06: 10 mg via INTRAVENOUS
  Administered 2024-05-06: 20 mg via INTRAVENOUS

## 2024-05-06 MED ORDER — TRANEXAMIC ACID-NACL 1000-0.7 MG/100ML-% IV SOLN
1000.0000 mg | Freq: Once | INTRAVENOUS | Status: AC
  Administered 2024-05-06: 1000 mg via INTRAVENOUS

## 2024-05-06 MED ORDER — ASPIRIN 81 MG PO CHEW
81.0000 mg | CHEWABLE_TABLET | Freq: Two times a day (BID) | ORAL | Status: DC
  Administered 2024-05-06 – 2024-05-07 (×2): 81 mg via ORAL
  Filled 2024-05-06 (×2): qty 1

## 2024-05-06 MED ORDER — TRANEXAMIC ACID-NACL 1000-0.7 MG/100ML-% IV SOLN
INTRAVENOUS | Status: AC
  Filled 2024-05-06: qty 100

## 2024-05-06 MED ORDER — POLYVINYL ALCOHOL 1.4 % OP SOLN
1.0000 [drp] | Freq: Four times a day (QID) | OPHTHALMIC | Status: DC
  Administered 2024-05-06: 1 [drp] via OPHTHALMIC
  Filled 2024-05-06: qty 15

## 2024-05-06 MED ORDER — FENTANYL CITRATE (PF) 100 MCG/2ML IJ SOLN
INTRAMUSCULAR | Status: AC
Start: 2024-05-06 — End: 2024-05-06
  Filled 2024-05-06: qty 2

## 2024-05-06 MED ORDER — FLEET ENEMA RE ENEM: 1.0000 | ENEMA | Freq: Once | RECTAL | Status: DC | PRN

## 2024-05-06 MED ORDER — ALUM & MAG HYDROXIDE-SIMETH 200-200-20 MG/5ML PO SUSP: 30.0000 mL | ORAL | Status: DC | PRN

## 2024-05-06 MED ORDER — BUPIVACAINE HCL (PF) 0.5 % IJ SOLN
INTRAMUSCULAR | Status: DC | PRN
Start: 2024-05-06 — End: 2024-05-06
  Administered 2024-05-06: 2.6 mL via INTRATHECAL

## 2024-05-06 MED ORDER — MENTHOL 3 MG MT LOZG: 1.0000 | LOZENGE | OROMUCOSAL | Status: DC | PRN

## 2024-05-06 MED ORDER — ACETAMINOPHEN 10 MG/ML IV SOLN
INTRAVENOUS | Status: DC | PRN
  Administered 2024-05-06: 1000 mg via INTRAVENOUS

## 2024-05-06 MED ORDER — CHLORHEXIDINE GLUCONATE 4 % EX SOLN
60.0000 mL | Freq: Once | CUTANEOUS | Status: AC
  Administered 2024-05-06: 4 via TOPICAL

## 2024-05-06 MED ORDER — ONDANSETRON HCL 4 MG/2ML IJ SOLN: 4.0000 mg | Freq: Four times a day (QID) | INTRAMUSCULAR | Status: DC | PRN

## 2024-05-06 MED ORDER — METOCLOPRAMIDE HCL 10 MG PO TABS
10.0000 mg | ORAL_TABLET | Freq: Three times a day (TID) | ORAL | Status: DC
  Administered 2024-05-06 – 2024-05-07 (×3): 10 mg via ORAL
  Filled 2024-05-06 (×3): qty 1

## 2024-05-06 MED ORDER — FERROUS SULFATE 325 (65 FE) MG PO TABS
325.0000 mg | ORAL_TABLET | Freq: Two times a day (BID) | ORAL | Status: DC
  Administered 2024-05-06 – 2024-05-07 (×2): 325 mg via ORAL
  Filled 2024-05-06 (×2): qty 1

## 2024-05-06 MED ORDER — LIDOCAINE HCL URETHRAL/MUCOSAL 2 % EX GEL
CUTANEOUS | Status: AC
Start: 2024-05-06 — End: 2024-05-06
  Filled 2024-05-06: qty 6

## 2024-05-06 MED ORDER — ORAL CARE MOUTH RINSE: 15.0000 mL | OROMUCOSAL | Status: DC | PRN

## 2024-05-06 MED ORDER — CHLORHEXIDINE GLUCONATE 0.12 % MT SOLN
15.0000 mL | Freq: Once | OROMUCOSAL | Status: AC
  Administered 2024-05-06: 15 mL via OROMUCOSAL

## 2024-05-06 MED ORDER — BISACODYL 10 MG RE SUPP: 10.0000 mg | Freq: Every day | RECTAL | Status: DC | PRN

## 2024-05-06 MED ORDER — LACTATED RINGERS IV SOLN: INTRAVENOUS | Status: DC

## 2024-05-06 MED ORDER — POLYETHYLENE GLYCOL 3350 17 G PO PACK
17.0000 g | PACK | ORAL | Status: DC
  Administered 2024-05-07: 17 g via ORAL
  Filled 2024-05-06: qty 1

## 2024-05-06 MED ORDER — CHLORHEXIDINE GLUCONATE 0.12 % MT SOLN
OROMUCOSAL | Status: AC
  Filled 2024-05-06: qty 15

## 2024-05-06 MED ORDER — ACETAMINOPHEN 10 MG/ML IV SOLN
1000.0000 mg | Freq: Four times a day (QID) | INTRAVENOUS | Status: DC
  Administered 2024-05-06 – 2024-05-07 (×3): 1000 mg via INTRAVENOUS
  Filled 2024-05-06 (×3): qty 100

## 2024-05-06 MED ORDER — RISAQUAD PO CAPS
1.0000 | ORAL_CAPSULE | ORAL | Status: DC
  Filled 2024-05-06: qty 1

## 2024-05-06 MED ORDER — PROPOFOL 1000 MG/100ML IV EMUL
INTRAVENOUS | Status: AC
  Filled 2024-05-06: qty 100

## 2024-05-06 MED ORDER — CEFAZOLIN SODIUM-DEXTROSE 2-4 GM/100ML-% IV SOLN
2.0000 g | Freq: Four times a day (QID) | INTRAVENOUS | Status: AC
  Administered 2024-05-06 – 2024-05-07 (×2): 2 g via INTRAVENOUS
  Filled 2024-05-06: qty 100

## 2024-05-06 SURGICAL SUPPLY — 47 items
BLADE CLIPPER SURG (BLADE) IMPLANT
BLADE SAW 90X25X1.19 OSCILLAT (BLADE) ×1 IMPLANT
BRUSH SCRUB EZ PLAIN DRY (MISCELLANEOUS) ×1 IMPLANT
CUP ACETBLR 52 OD 100 SERIES (Hips) IMPLANT
DRAPE INCISE IOBAN 66X60 STRL (DRAPES) ×1 IMPLANT
DRAPE SHEET LG 3/4 BI-LAMINATE (DRAPES) ×1 IMPLANT
DRSG AQUACEL AG ADV 3.5X14 (GAUZE/BANDAGES/DRESSINGS) ×1 IMPLANT
DRSG MEPILEX SACRM 8.7X9.8 (GAUZE/BANDAGES/DRESSINGS) ×1 IMPLANT
DRSG TEGADERM 4X4.75 (GAUZE/BANDAGES/DRESSINGS) ×1 IMPLANT
DURAPREP 26ML APPLICATOR (WOUND CARE) ×2 IMPLANT
ELECT CAUTERY BLADE 6.4 (BLADE) ×1 IMPLANT
ELECTRODE REM PT RTRN 9FT ADLT (ELECTROSURGICAL) ×1 IMPLANT
EVACUATOR 1/8 PVC DRAIN (DRAIN) ×1 IMPLANT
FEMORAL STEM 12/14 TPR SZ4 HIP (Orthopedic Implant) IMPLANT
GAUZE XEROFORM 1X8 LF (GAUZE/BANDAGES/DRESSINGS) ×1 IMPLANT
GLOVE BIOGEL M STRL SZ7.5 (GLOVE) ×6 IMPLANT
GLOVE BIOGEL PI IND STRL 8 (GLOVE) ×1 IMPLANT
GLOVE SRG 8 PF TXTR STRL LF DI (GLOVE) ×1 IMPLANT
GOWN STRL REUS W/ TWL LRG LVL3 (GOWN DISPOSABLE) ×2 IMPLANT
GOWN STRL REUS W/ TWL XL LVL3 (GOWN DISPOSABLE) ×1 IMPLANT
GOWN TOGA ZIPPER T7+ PEEL AWAY (MISCELLANEOUS) ×1 IMPLANT
HANDLE YANKAUER SUCT OPEN TIP (MISCELLANEOUS) ×1 IMPLANT
HEAD M SROM 36MM PLUS 1.5 (Hips) IMPLANT
HOLDER FOLEY CATH W/STRAP (MISCELLANEOUS) ×1 IMPLANT
HOOD PEEL AWAY T7 (MISCELLANEOUS) ×1 IMPLANT
KIT PEG BOARD PINK (KITS) ×1 IMPLANT
KIT TURNOVER KIT A (KITS) ×1 IMPLANT
LINER NEUTRAL 52X36MM PLUS 4 (Liner) IMPLANT
MANIFOLD NEPTUNE II (INSTRUMENTS) ×2 IMPLANT
NS IRRIG 500ML POUR BTL (IV SOLUTION) ×1 IMPLANT
PACK HIP PROSTHESIS (MISCELLANEOUS) ×1 IMPLANT
PENCIL SMOKE EVACUATOR COATED (MISCELLANEOUS) ×1 IMPLANT
PIN STEIN THRED 5/32 (Pin) ×1 IMPLANT
SOL .9 NS 3000ML IRR UROMATIC (IV SOLUTION) ×1 IMPLANT
SOLUTION IRRIG SURGIPHOR (IV SOLUTION) ×1 IMPLANT
SPONGE DRAIN TRACH 4X4 STRL 2S (GAUZE/BANDAGES/DRESSINGS) ×1 IMPLANT
STAPLER SKIN PROX 35W (STAPLE) ×1 IMPLANT
SUT ETHIBOND #5 BRAIDED 30INL (SUTURE) ×1 IMPLANT
SUT VIC AB 0 CT1 36 (SUTURE) ×2 IMPLANT
SUT VIC AB 1 CT1 36 (SUTURE) ×2 IMPLANT
SUT VIC AB 2-0 CT1 TAPERPNT 27 (SUTURE) ×1 IMPLANT
TAPE CLOTH 3X10 WHT NS LF (GAUZE/BANDAGES/DRESSINGS) ×1 IMPLANT
TIP FAN IRRIG PULSAVAC PLUS (DISPOSABLE) ×1 IMPLANT
TOWEL OR 17X26 4PK STRL BLUE (TOWEL DISPOSABLE) IMPLANT
TRAP FLUID SMOKE EVACUATOR (MISCELLANEOUS) ×1 IMPLANT
TRAY FOLEY MTR SLVR 16FR STAT (SET/KITS/TRAYS/PACK) ×1 IMPLANT
WATER STERILE IRR 1000ML POUR (IV SOLUTION) ×1 IMPLANT

## 2024-05-06 NOTE — Op Note (Deleted)
 OPERATIVE NOTE  DATE OF SURGERY:  05/06/2024  PATIENT NAME:  Judith Kim   DOB: [birth year]  MRN: [id number]  PRE-OPERATIVE DIAGNOSIS: Degenerative arthrosis of the right hip, primary  POST-OPERATIVE DIAGNOSIS:  Same  PROCEDURE:  Right total hip arthroplasty  SURGEON:  Lynwood SHAUNNA Mardee Mickey. M.D.  ASSISTANT:  Sidra Koyanagi, PA-C (present and scrubbed throughout the case, critical for assistance with exposure, retraction, instrumentation, and closure)  ANESTHESIA: spinal  ESTIMATED BLOOD LOSS: 75 mL  FLUIDS REPLACED: 700 mL of crystalloid  DRAINS: 2 medium Hemovac drains  IMPLANTS UTILIZED: DePuy size 8 standard offset Actis femoral stem, 58 mm OD Pinnacle GRIPTION Sector acetabular component, 6 mm x 25 mm cancellous screw, +4 mm neutral Pinnacle Marathon polyethylene insert, and a 36 mm M-SPEC +1.5 mm hip ball  INDICATIONS FOR SURGERY: Judith Kim is a 88 y.o. year old female with a long history of progressive hip and groin  pain. X-rays demonstrated severe degenerative changes. The patient had not seen any significant improvement despite conservative nonsurgical intervention. After discussion of the risks and benefits of surgical intervention, the patient expressed understanding of the risks benefits and agree with plans for total hip arthroplasty.   The risks, benefits, and alternatives were discussed at length including but not limited to the risks of infection, bleeding, nerve injury, stiffness, blood clots, the need for revision surgery, limb length inequality, dislocation, cardiopulmonary complications, among others, and they were willing to proceed.  PROCEDURE IN DETAIL: The patient was brought into the operating room and, after adequate spinal anesthesia was achieved, the patient was placed in a left lateral decubitus position. Axillary roll was placed and all bony prominences were well-padded. The patient's right hip was cleaned and prepped with alcohol  and DuraPrep and  draped in the usual sterile fashion. A timeout was performed as per usual protocol. A lateral curvilinear incision was made gently curving towards the posterior superior iliac spine. The IT band was incised in line with the skin incision and the fibers of the gluteus maximus were split in line. The piriformis tendon was identified, skeletonized, and incised at its insertion to the proximal femur and reflected posteriorly. A T type posterior capsulotomy was performed. Prior to dislocation of the femoral head, a threaded Steinmann pin was inserted through a separate stab incision into the pelvis superior to the acetabulum and bent in the form of a stylus so as to assess limb length and hip offset throughout the procedure. The femoral head was then dislocated posteriorly. Inspection of the femoral head demonstrated severe degenerative changes with full-thickness loss of articular cartilage. The femoral neck cut was performed using an oscillating saw. The anterior capsule was elevated off of the femoral neck using a periosteal elevator. Attention was then directed to the acetabulum. The remnant of the labrum was excised using electrocautery. Inspection of the acetabulum also demonstrated significant degenerative changes. The acetabulum was reamed in sequential fashion up to a 57 mm diameter. Good punctate bleeding bone was encountered. A 58 mm Pinnacle GRIPTION Sector acetabular component was positioned and impacted into place. Good scratch fit was appreciated.  A 6.5 mm x 25 mm cancellous screw was inserted through the superior dome hole.  A +4 mm neutral polyethylene trial was inserted.  Attention was then directed to the proximal femur.  Femoral broaches were inserted in a sequential fashion up to a size 8 broach. Calcar region was planed and a trial reduction was performed using a standard offset neck and a 36 mm  hip ball with a +1.5 mm neck length. Good equalization of limb lengths and hip offset was  appreciated and excellent stability was noted both anteriorly and posteriorly. Trial components were removed. The acetabular shell was irrigated with copious amounts of normal saline with antibiotic solution and suctioned dry. A +4 mm neutral Pinnacle Marathon polyethylene insert was positioned and impacted into place. Next, a size 8 standard offset Actis femoral stem was positioned and impacted into place. Excellent scratch fit was appreciated. A trial reduction was again performed with a 36 mm hip ball with a +1.5 mm neck length. Again, good equalization of limb lengths was appreciated and excellent stability appreciated both anteriorly and posteriorly. The hip was then dislocated and the trial hip ball was removed. The Morse taper was cleaned and dried. A 36 mm M-SPEC hip ball with a +1.5 mm neck length was placed on the trunnion and impacted into place. The hip was then reduced and placed through range of motion. Excellent stability was appreciated both anteriorly and posteriorly.  The wound was irrigated with copious amounts of normal saline followed by 450 ml of Surgiphor and suctioned dry. Good hemostasis was appreciated. The posterior capsulotomy was repaired using #5 Ethibond. Piriformis tendon was reapproximated to the undersurface of the gluteus medius tendon using #5 Ethibond. The IT band was reapproximated using interrupted sutures of #1 Vicryl. Subcutaneous tissue was approximated using first #0 Vicryl followed by #2-0 Vicryl. The skin was closed with skin staples.  The patient tolerated the procedure well and was transported to the recovery room in stable condition.   Lynwood SHAUNNA Mardee Mickey., M.D.

## 2024-05-06 NOTE — Anesthesia Procedure Notes (Signed)
 Spinal  Patient location during procedure: OR Start time: 05/06/2024 12:40 PM End time: 05/06/2024 12:45 PM Reason for block: surgical anesthesia Staffing Performed: resident/CRNA  Anesthesiologist: Dario Barter, MD Resident/CRNA: Lorrene Camelia LABOR, CRNA Performed by: Lorrene Camelia LABOR, CRNA Authorized by: Dario Barter, MD   Preanesthetic Checklist Completed: patient identified, IV checked, site marked, risks and benefits discussed, surgical consent, monitors and equipment checked and pre-op evaluation Spinal Block Patient position: sitting Prep: Betadine Patient monitoring: heart rate, continuous pulse ox, blood pressure and cardiac monitor Approach: midline Location: L4-5 Injection technique: single-shot Needle Needle type: Introducer and Pencan  Needle gauge: 24 G Needle length: 9 cm Assessment Events: CSF return Additional Notes Negative paresthesia. Negative blood return. Positive free-flowing CSF. Expiration date of kit checked and confirmed. Patient tolerated procedure well, without complications.

## 2024-05-06 NOTE — Transfer of Care (Signed)
 Immediate Anesthesia Transfer of Care Note  Patient: Judith Kim  Procedure(s) Performed: ARTHROPLASTY, HIP, TOTAL,POSTERIOR APPROACH (Right: Hip)  Patient Location: PACU  Anesthesia Type:Spinal  Level of Consciousness: awake, alert , and oriented  Airway & Oxygen Therapy: Patient Spontanous Breathing and Patient connected to face mask oxygen  Post-op Assessment: Report given to RN and Post -op Vital signs reviewed and stable  Post vital signs: Reviewed and stable  Last Vitals:  Vitals Value Taken Time  BP 112/80 05/06/24 15:49  Temp 37.3 C 05/06/24 15:46  Pulse 60 05/06/24 15:49  Resp 16 05/06/24 15:49  SpO2 100 % 05/06/24 15:49  Vitals shown include unfiled device data.  Last Pain:  Vitals:   05/06/24 1108  TempSrc: Temporal  PainSc: 0-No pain         Complications: No notable events documented.

## 2024-05-06 NOTE — Op Note (Signed)
 OPERATIVE NOTE  DATE OF SURGERY:  05/06/2024  PATIENT NAME:  Judith Kim   DOB: May 19, 1936  MRN: [id number]  PRE-OPERATIVE DIAGNOSIS: Degenerative arthrosis of the right hip, primary  POST-OPERATIVE DIAGNOSIS:  Same  PROCEDURE:  Right total hip arthroplasty  SURGEON:  Lynwood SHAUNNA Mardee Mickey. M.D.  ASSISTANT:  Sidra Koyanagi, PA-C (present and scrubbed throughout the case, critical for assistance with exposure, retraction, instrumentation, and closure)  ANESTHESIA: spinal  ESTIMATED BLOOD LOSS: 100 mL  FLUIDS REPLACED: 900 mL of crystalloid  DRAINS: 2 medium hemovac drains  IMPLANTS UTILIZED: DePuy size 4 high offset Actis femoral stem, 52 mm OD Pinnacle 100 acetabular component, +4 mm neutral Pinnacle Altrx polyethylene insert, and a 36 mm M-SPEC +1.5 mm hip ball  INDICATIONS FOR SURGERY: Judith Kim is a 88 y.o. year old female with a long history of progressive hip and groin  pain. X-rays demonstrated severe degenerative changes. The patient had not seen any significant improvement despite conservative nonsurgical intervention. After discussion of the risks and benefits of surgical intervention, the patient expressed understanding of the risks benefits and agree with plans for total hip arthroplasty.   The risks, benefits, and alternatives were discussed at length including but not limited to the risks of infection, bleeding, nerve injury, stiffness, blood clots, the need for revision surgery, limb length inequality, dislocation, cardiopulmonary complications, among others, and they were willing to proceed.  PROCEDURE IN DETAIL: The patient was brought into the operating room and, after adequate spinal anesthesia was achieved, the patient was placed in a left lateral decubitus position. Axillary roll was placed and all bony prominences were well-padded. The patient's right hip was cleaned and prepped with alcohol  and DuraPrep and draped in the usual sterile fashion. A timeout  was performed as per usual protocol. A lateral curvilinear incision was made gently curving towards the posterior superior iliac spine. The IT band was incised in line with the skin incision and the fibers of the gluteus maximus were split in line. The piriformis tendon was identified, skeletonized, and incised at its insertion to the proximal femur and reflected posteriorly. A T type posterior capsulotomy was performed. Prior to dislocation of the femoral head, a threaded Steinmann pin was inserted through a separate stab incision into the pelvis superior to the acetabulum and bent in the form of a stylus so as to assess limb length and hip offset throughout the procedure. The femoral head was then dislocated posteriorly. Inspection of the femoral head demonstrated severe degenerative changes with full-thickness loss of articular cartilage. The femoral neck cut was performed using an oscillating saw. The anterior capsule was elevated off of the femoral neck using a periosteal elevator. Attention was then directed to the acetabulum. The remnant of the labrum was excised using electrocautery. Inspection of the acetabulum also demonstrated significant degenerative changes. The acetabulum was reamed in sequential fashion up to a 51 mm diameter. Good punctate bleeding bone was encountered. A 52 mm Pinnacle 100 acetabular component was positioned and impacted into place. Good scratch fit was appreciated. A +4 mm neutral polyethylene trial was inserted.  Attention was then directed to the proximal femur.  Femoral broaches were inserted in a sequential fashion up to a size 4 broach. Calcar region was planed and a trial reduction was performed using a high offset neck and a 36 mm hip ball with a +1.5 mm neck length. Good equalization of limb lengths and hip offset was appreciated and excellent stability was noted both anteriorly and  posteriorly. Trial components were removed. The acetabular shell was irrigated with  copious amounts of normal saline with antibiotic solution and suctioned dry. A +4 mm Pinnacle Altrx polyethylene insert was positioned and impacted into place. Next, a size 4 high offset Actis femoral stem was positioned and impacted into place. Excellent scratch fit was appreciated. A trial reduction was again performed with a 36 mm hip ball with a +1.5 mm neck length. Again, good equalization of limb lengths was appreciated and excellent stability appreciated both anteriorly and posteriorly. The hip was then dislocated and the trial hip ball was removed. The Morse taper was cleaned and dried. A 36 mm M-SPEC hip ball with a +1.5 mm neck length was placed on the trunnion and impacted into place. The hip was then reduced and placed through range of motion. Excellent stability was appreciated both anteriorly and posteriorly.  The wound was irrigated with copious amounts of normal saline followed by 450 ml of Surgiphor and suctioned dry. Good hemostasis was appreciated. The posterior capsulotomy was repaired using #5 Ethibond. Piriformis tendon was reapproximated to the undersurface of the gluteus medius tendon using #5 Ethibond. The IT band was reapproximated using interrupted sutures of #1 Vicryl. Subcutaneous tissue was approximated using first #0 Vicryl followed by #2-0 Vicryl. The skin was closed with skin staples.  The patient tolerated the procedure well and was transported to the recovery room in stable condition.   Lynwood SHAUNNA Mardee Mickey., M.D.

## 2024-05-06 NOTE — Anesthesia Procedure Notes (Signed)
 Procedure Name: MAC Date/Time: 05/06/2024 12:45 PM  Performed by: Lorrene Camelia LABOR, CRNAPre-anesthesia Checklist: Patient identified and Emergency Drugs available Patient Re-evaluated:Patient Re-evaluated prior to induction Oxygen Delivery Method: Simple face mask Preoxygenation: Pre-oxygenation with 100% oxygen Induction Type: IV induction

## 2024-05-06 NOTE — Anesthesia Preprocedure Evaluation (Signed)
 Anesthesia Evaluation  Patient identified by MRN, date of birth, ID band Patient awake    Reviewed: Allergy & Precautions, H&P , NPO status , Patient's Chart, lab work & pertinent test results, reviewed documented beta blocker date and time   History of Anesthesia Complications Negative for: history of anesthetic complications  Airway Mallampati: I  TM Distance: >3 FB Neck ROM: full    Dental  (+) Caps, Dental Advidsory Given, Implants, Poor Dentition   Pulmonary neg pulmonary ROS   Pulmonary exam normal breath sounds clear to auscultation       Cardiovascular Exercise Tolerance: Good negative cardio ROS  Rhythm:regular Rate:Normal     Neuro/Psych neg Seizures PSYCHIATRIC DISORDERS Anxiety Depression     Neuromuscular disease    GI/Hepatic negative GI ROS, Neg liver ROS,,,  Endo/Other  negative endocrine ROS    Renal/GU Renal disease  negative genitourinary   Musculoskeletal   Abdominal   Peds  Hematology negative hematology ROS (+) Blood dyscrasia, anemia   Anesthesia Other Findings Past Medical History: No date: Anemia No date: Anxiety No date: Cataract cortical, senile No date: Chronic kidney disease No date: Colon polyp No date: Constipation No date: Depression No date: DNR no code (do not resuscitate) No date: GERD (gastroesophageal reflux disease) No date: H/O hematuria No date: Hemochromatosis No date: Hyperlipidemia     Comment:  since 10/2013,  Dr. Dennise has cleared her from meds and               reported to her her levels are [facility].  No date: Incontinence in female No date: Osteoarthritis of both hands No date: Osteoarthritis of right hip No date: Osteopenia No date: Osteoporosis No date: Overactive bladder No date: Stress incontinence No date: Vaginal atrophy No date: Wears hearing aid in both ears   Reproductive/Obstetrics negative OB ROS                               Anesthesia Physical Anesthesia Plan  ASA: 2  Anesthesia Plan: Spinal   Post-op Pain Management:    Induction:   PONV Risk Score and Plan: 2 and Propofol  infusion and TIVA  Airway Management Planned: Natural Airway and Simple Face Mask  Additional Equipment:   Intra-op Plan:   Post-operative Plan:   Informed Consent: I have reviewed the patients History and Physical, chart, labs and discussed the procedure including the risks, benefits and alternatives for the proposed anesthesia with the patient or authorized representative who has indicated his/her understanding and acceptance.     Dental Advisory Given  Plan Discussed with: CRNA  Anesthesia Plan Comments:          Anesthesia Quick Evaluation

## 2024-05-06 NOTE — Interval H&P Note (Signed)
 History and Physical Interval Note:  05/06/2024 12:19 PM  Judith Kim  has presented today for surgery, with the diagnosis of Primary osteoarthritis of right hip.  The various methods of treatment have been discussed with the patient and family. After consideration of risks, benefits and other options for treatment, the patient has consented to  Procedure(s): ARTHROPLASTY, HIP, TOTAL,POSTERIOR APPROACH (Right) as a surgical intervention.  The patient's history has been reviewed, patient examined, no change in status, stable for surgery.  I have reviewed the patient's chart and labs.  Questions were answered to the patient's satisfaction.     Lindsey Hommel P Rebbie Lauricella

## 2024-05-06 NOTE — H&P (View-Only) (Signed)
 Patient is not able to walk the distance required to go the bathroom, or he/she is unable to safely negotiate stairs required to access the bathroom.  A 3in1 BSC will alleviate this problem   Amenda Duclos P. Angie Fava M.D.

## 2024-05-06 NOTE — Anesthesia Procedure Notes (Signed)
 Date/Time: 05/06/2024 1:09 PM  Performed by: Lorrene Salines A, CRNAVentilation: Nasal airway inserted- appropriate to patient size Comments: Size 6 in right nare with xylocaine 

## 2024-05-06 NOTE — Progress Notes (Signed)
 Patient is not able to walk the distance required to go the bathroom, or he/she is unable to safely negotiate stairs required to access the bathroom.  A 3in1 BSC will alleviate this problem   Amenda Duclos P. Angie Fava M.D.

## 2024-05-06 NOTE — Plan of Care (Signed)
  Problem: Pain Managment: Goal: General experience of comfort will improve and/or be controlled Outcome: Progressing   Problem: Safety: Goal: Ability to remain free from injury will improve Outcome: Progressing

## 2024-05-07 ENCOUNTER — Encounter: Payer: Self-pay | Admitting: Orthopedic Surgery

## 2024-05-07 DIAGNOSIS — Z96641 Presence of right artificial hip joint: Secondary | ICD-10-CM | POA: Diagnosis not present

## 2024-05-07 DIAGNOSIS — S62309A Unspecified fracture of unspecified metacarpal bone, initial encounter for closed fracture: Secondary | ICD-10-CM | POA: Diagnosis not present

## 2024-05-07 DIAGNOSIS — M1611 Unilateral primary osteoarthritis, right hip: Secondary | ICD-10-CM | POA: Diagnosis not present

## 2024-05-07 MED ORDER — RISAQUAD PO CAPS
1.0000 | ORAL_CAPSULE | Freq: Every day | ORAL | Status: DC
  Administered 2024-05-07: 1 via ORAL
  Filled 2024-05-07: qty 1

## 2024-05-07 MED ORDER — ASPIRIN 81 MG PO CHEW: 81.0000 mg | CHEWABLE_TABLET | Freq: Two times a day (BID) | ORAL | Status: DC

## 2024-05-07 MED ORDER — OXYCODONE HCL 5 MG PO TABS: 5.0000 mg | ORAL_TABLET | ORAL | 0 refills | Status: DC | PRN

## 2024-05-07 MED ORDER — CELECOXIB 200 MG PO CAPS: 200.0000 mg | ORAL_CAPSULE | Freq: Two times a day (BID) | ORAL | 1 refills | Status: DC

## 2024-05-07 MED ORDER — TRAMADOL HCL 50 MG PO TABS: 50.0000 mg | ORAL_TABLET | ORAL | 0 refills | Status: DC | PRN

## 2024-05-07 NOTE — Progress Notes (Signed)
 DISCHARGE NOTE:  Pt and son given discharge instructions, scripts and 2 honeycomb dressings. They verbalized understanding. TED hose on both legs, bilateral hearings aids on, BSC and walker sent with pt. Pt wheeled to car by staff, son providing transportation.

## 2024-05-07 NOTE — TOC Transition Note (Signed)
 Transition of Care [facility]) - Discharge Note   Patient Details  Name: Judith Kim MRN: [id number] Date of Birth: 1935-11-01  Transition of Care [facility]) CM/SW Contact:  Alvaro Louder, LCSW Phone Number: 05/07/2024, 3:49 PM   Clinical Narrative:   LCSWA met with the patient to discuss PT recommendation. Patient indicated that she will do OP rehab at [facility]. LCSWA reached out to [employer] to confirm. [employer] features editor confirmed OP Rehab of patient. LCSWA faxed over MD orders. Patient indicated that she will be took home by her son  [facility] Signing off    Final next level of care: OP Rehab (Returning to [facility]) Barriers to Discharge: No Barriers Identified   Patient Goals and CMS Choice            Discharge Placement              Patient chooses bed at: [facility] Patient to be transferred to facility by: Ginger Name of family member notified: Self Patient and family notified of of transfer: 05/07/24  Discharge Plan and Services Additional resources added to the After Visit Summary for                  DME Arranged: Walker rolling, Bedside commode DME Agency: [employer] Date DME Agency Contacted: 05/07/24                Social Drivers of Health (SDOH) Interventions SDOH Screenings   Food Insecurity: No Food Insecurity (05/06/2024)  Housing: Low Risk  (05/06/2024)  Transportation Needs: No Transportation Needs (05/06/2024)  Utilities: Not At Risk (05/06/2024)  Financial Resource Strain: Low Risk  (03/26/2024)   Received from [facility]  Social Connections: Moderately Integrated (05/06/2024)  Tobacco Use: Low Risk  (05/06/2024)     Readmission Risk Interventions     No data to display

## 2024-05-07 NOTE — Evaluation (Signed)
 Physical Therapy Evaluation Patient Details Name: Judith Kim MRN: [id number] DOB: May 06, 1936 Today's Date: 05/07/2024  History of Present Illness  Pt is a 88 year old female s/p R THA on 05/06/24  Clinical Impression  Pt is a pleasant 88 year old female who was admitted for R THR and is POD 1 at time of evaluation. Pt performs transfers with CGA and ambulation with supervision using RW. Pt demonstrates deficits with strength/pain/mobility. Heavy reminders for post hip precautions. Family at bedside and educated. Pt is doing extremely well and perform stair training. Would benefit from skilled PT to address above deficits and promote optimal return to PLOF. Pt will continue to receive skilled PT services while admitted and will defer to TOC/care team for updates regarding disposition planning.       If plan is discharge home, recommend the following: A little help with walking and/or transfers;A little help with bathing/dressing/bathroom   Can travel by private vehicle        Equipment Recommendations Rolling walker (2 wheels);BSC/3in1  Recommendations for Other Services       Functional Status Assessment Patient has had a recent decline in their functional status and demonstrates the ability to make significant improvements in function in a reasonable and predictable amount of time.     Precautions / Restrictions Precautions Precautions: Fall;Posterior Hip Precaution Booklet Issued: Yes (comment) Recall of Precautions/Restrictions: Intact Restrictions Weight Bearing Restrictions Per Provider Order: Yes RLE Weight Bearing Per Provider Order: Weight bearing as tolerated      Mobility  Bed Mobility               General bed mobility comments: NT in recliner pre/post session    Transfers Overall transfer level: Needs assistance Equipment used: Rolling walker (2 wheels) Transfers: Sit to/from Stand Sit to Stand: Contact guard assist           General transfer  comment: cues for hand placement. Once standing, upright posture noted    Ambulation/Gait Ambulation/Gait assistance: Supervision Gait Distance (Feet): 250 Feet Assistive device: Rolling walker (2 wheels) Gait Pattern/deviations: Step-through pattern       General Gait Details: ambulated with reciprocal gait pattern and upright posture. Needs cues for sequencing and reminders for hip precautions.  Stairs Stairs: Yes Stairs assistance: Supervision Stair Management: Two rails, Step to pattern, Forwards Number of Stairs: 4 General stair comments: safe technique. Demonstration given prior to performance  Wheelchair Mobility     Tilt Bed    Modified Rankin (Stroke Patients Only)       Balance Overall balance assessment: Needs assistance Sitting-balance support: Feet supported Sitting balance-Leahy Scale: Good     Standing balance support: Bilateral upper extremity supported, During functional activity, Reliant on assistive device for balance Standing balance-Leahy Scale: Fair                               Pertinent Vitals/Pain Pain Assessment Pain Assessment: 0-10 Pain Score: 1  Pain Location: R hip Pain Descriptors / Indicators: Discomfort, Sore Pain Intervention(s): Limited activity within patient's tolerance, Premedicated before session, Repositioned    Home Living Family/patient expects to be discharged to:: Private residence [facility]) Living Arrangements: Alone Available Help at Discharge: Family;Available PRN/intermittently Type of Home: Apartment Home Access: Elevator   Entrance Stairs-Number of Steps: 3 flights but has an Engineer, structural   Home Layout: One level Home Equipment: Shower seat;Toilet riser      Prior Function Prior  Level of Function : Independent/Modified Independent;Driving             Mobility Comments: indep, reports no falls ADLs Comments: indep     Extremity/Trunk Assessment   Upper Extremity Assessment Upper  Extremity Assessment: Overall WFL for tasks assessed    Lower Extremity Assessment Lower Extremity Assessment: Generalized weakness (R LE grossly 3+/5)       Communication   Communication Communication: No apparent difficulties    Cognition Arousal: Alert Behavior During Therapy: WFL for tasks assessed/performed   PT - Cognitive impairments: No apparent impairments                       PT - Cognition Comments: pleasant and agreeable to session Following commands: Intact       Cueing Cueing Techniques: Verbal cues     General Comments General comments (skin integrity, edema, etc.): vss throughout, dressing intact pre/post session    Exercises Other Exercises Other Exercises: reviewed HEP and freq/duration. Other Exercises: ambulation performed to bathroom, CGA for transfers. Able to perform self hygiene   Assessment/Plan    PT Assessment All further PT needs can be met in the next venue of care  PT Problem List Decreased strength;Decreased activity tolerance;Decreased balance;Decreased mobility;Decreased knowledge of use of DME;Pain       PT Treatment Interventions      PT Goals (Current goals can be found in the Care Plan section)  Acute Rehab PT Goals Patient Stated Goal: to go home PT Goal Formulation: With patient Time For Goal Achievement: 05/21/24 Potential to Achieve Goals: Good    Frequency       Co-evaluation               AM-PAC PT 6 Clicks Mobility  Outcome Measure Help needed turning from your back to your side while in a flat bed without using bedrails?: None Help needed moving from lying on your back to sitting on the side of a flat bed without using bedrails?: A Little Help needed moving to and from a bed to a chair (including a wheelchair)?: A Little Help needed standing up from a chair using your arms (e.g., wheelchair or bedside chair)?: A Little Help needed to walk in hospital room?: A Little Help needed climbing 3-5  steps with a railing? : A Little 6 Click Score: 19    End of Session Equipment Utilized During Treatment: Gait belt Activity Tolerance: Patient tolerated treatment well Patient left: in chair;with family/visitor present Nurse Communication: Mobility status PT Visit Diagnosis: Muscle weakness (generalized) (M62.81);Difficulty in walking, not elsewhere classified (R26.2);Pain Pain - Right/Left: Right Pain - part of body: Hip    Time: 9050-8968 PT Time Calculation (min) (ACUTE ONLY): 42 min   Charges:   PT Evaluation $PT Eval Low Complexity: 1 Low PT Treatments $Gait Training: 8-22 mins $Therapeutic Exercise: 8-22 mins PT General Charges $$ ACUTE PT VISIT: 1 Visit         Corean Dade, PT, DPT, GCS [phone]   Jayant Kriz 05/07/2024, 10:45 AM

## 2024-05-07 NOTE — Care Management Obs Status (Signed)
 MEDICARE OBSERVATION STATUS NOTIFICATION   Patient Details  Name: Judith Kim MRN: [id number] Date of Birth: 12-Mar-1936   Medicare Observation Status Notification Given:  No (patient did not want a copy)    Rojelio SHAUNNA Rattler 05/07/2024, 11:22 AM

## 2024-05-07 NOTE — Anesthesia Postprocedure Evaluation (Signed)
 Anesthesia Post Note  Patient: Judith Kim  Procedure(s) Performed: ARTHROPLASTY, HIP, TOTAL,POSTERIOR APPROACH (Right: Hip)  Patient location during evaluation: Nursing Unit Anesthesia Type: Spinal Level of consciousness: awake Pain management: pain level controlled Respiratory status: spontaneous breathing Cardiovascular status: stable Postop Assessment: no headache Anesthetic complications: no   No notable events documented.   Last Vitals:  Vitals:   05/06/24 2356 05/07/24 0405  BP: (!) 106/58 110/60  Pulse: 62 60  Resp: 16 16  Temp: 36.6 C 36.8 C  SpO2: 98% 98%    Last Pain:  Vitals:   05/07/24 0615  TempSrc:   PainSc: 3                  [name]

## 2024-05-07 NOTE — Progress Notes (Signed)
 Subjective: 1 Day Post-Op Procedure(s) (LRB): ARTHROPLASTY, HIP, TOTAL,POSTERIOR APPROACH (Right) Patient reports pain as mild.   Patient seen in rounds with Dr. Mardee. Patient is well, and has had no acute complaints or problems Denies any CP, SOB, N/V, fevers or chills We will start therapy today.  Plan is to go Home after hospital stay.  Objective: Vital signs in last 24 hours: Temp:  [97.5 F (36.4 C)-99.1 F (37.3 C)] 98.3 F (36.8 C) (08/07 0405) Pulse Rate:  [55-86] (P) 61 (08/07 0743) Resp:  [12-20] (P) 16 (08/07 0743) BP: (103-153)/(54-82) (P) 110/53 (08/07 0743) SpO2:  [95 %-100 %] (P) 99 % (08/07 0743) Weight:  [54.1 kg] 54.1 kg (08/06 1108)  Intake/Output from previous day:  Intake/Output Summary (Last 24 hours) at 05/07/2024 0901 Last data filed at 05/07/2024 0424 Gross per 24 hour  Intake 2806.73 ml  Output 1090 ml  Net 1716.73 ml    Intake/Output this shift: No intake/output data recorded.  Labs: No results for input(s): HGB in the last 72 hours. No results for input(s): WBC, RBC, HCT, PLT in the last 72 hours. No results for input(s): NA, K, CL, CO2, BUN, CREATININE, GLUCOSE, CALCIUM in the last 72 hours. No results for input(s): LABPT, INR in the last 72 hours.  EXAM General - Patient is Alert, Appropriate, and Oriented Extremity - Neurologically intact Neurovascular intact Sensation intact distally Intact pulses distally Dorsiflexion/Plantar flexion intact No cellulitis present Compartment soft Dressing - dressing C/D/I and no drainage Motor Function - intact, moving foot and toes well on exam. JP Drain pulled without difficulty. Intact  Past Medical History:  Diagnosis Date   Anemia    Anxiety    Cataract cortical, senile    Chronic kidney disease    Colon polyp    Constipation    Depression    DNR no code (do not resuscitate)    GERD (gastroesophageal reflux disease)    H/O hematuria    Hemochromatosis     Hyperlipidemia    since 10/2013,  Dr. Dennise has cleared her from meds and reported to her her levels are [facility].    Incontinence in female    Osteoarthritis of both hands    Osteoarthritis of right hip    Osteopenia    Osteoporosis    Overactive bladder    Stress incontinence    Vaginal atrophy    Wears hearing aid in both ears     Assessment/Plan: 1 Day Post-Op Procedure(s) (LRB): ARTHROPLASTY, HIP, TOTAL,POSTERIOR APPROACH (Right) Principal Problem:   Hx of total hip arthroplasty, right  Estimated body mass index is 19.24 kg/m as calculated from the following:   Height as of this encounter: 5' 6 (1.676 m).   Weight as of this encounter: 54.1 kg. Advance diet Up with therapy  Patient will continue to work with physical therapy to pass postoperative PT protocols, ROM and strengthening  Hip Preacutions  Discussed with the patient continuing to utilize ice over the bandage  Patient will wear TED hose bilaterally to help prevent DVT and clot formation  Discussed the Aquacel bandage.  This bandage will stay in place 7 days postoperatively.  Can be replaced with honeycomb bandages that will be sent home with the patient  Discussed sending the patient home with tramadol  and oxycodone  for as needed pain management.  Patient will also be sent home with Celebrex  to help with swelling and inflammation.  Patient will take an 81 mg aspirin  twice daily for DVT prophylaxis  JP drain  removed without difficulty, intact  Weight-Bearing as tolerated to right leg  Patient will follow-up with [facility] orthopedics in 6 weeks for re-imaging and reevaluation   Fonda Koyanagi, PA-C [facility] 05/07/2024, 9:01 AM

## 2024-05-07 NOTE — Plan of Care (Signed)
  Problem: Elimination: Goal: Will not experience complications related to urinary retention Outcome: Progressing   Problem: Pain Managment: Goal: General experience of comfort will improve and/or be controlled Outcome: Progressing

## 2024-05-07 NOTE — Discharge Summary (Addendum)
 Physician Discharge Summary  Subjective: 1 Day Post-Op Procedure(s) (LRB): ARTHROPLASTY, HIP, TOTAL,POSTERIOR APPROACH (Right) Patient reports pain as mild.   Patient seen in rounds with Dr. Mardee. Patient is well, and has had no acute complaints or problems Denies any CP, SOB, N/V, fevers or chills We will start therapy today.  Patient is ready to go home  Physician Discharge Summary  Patient ID: Judith Kim MRN: [id number] DOB/AGE: [birth year] 88 y.o.  Admit date: 05/06/2024 Discharge date: 05/07/2024  Admission Diagnoses:  Discharge Diagnoses:  Principal Problem:   Hx of total hip arthroplasty, right   Discharged Condition: good  Hospital Course: Patient presented to the hospital on 05/06/2024 for an elective right total hip arthroplasty performed by Dr. Mardee. Patient was given 1g of TXA and 2g of Ancef  prior to the procedure. she tolerated the procedure well without any complications. See procedural note below for details. Postoperatively, the patient did very well. she was able to pass PT protocols on post-op day one without any issues. JP drain was removed without any difficulty and was intact. she was able to void her bladder without any difficulty. Physical exam was unremarkable. she denies any SOB, CP, N/V, fevers or chills. Vital signs are stable. Patient is stable to discharge home.  PROCEDURE:  Right total hip arthroplasty   SURGEON:  Lynwood SHAUNNA Mardee Mickey. M.D.   ASSISTANT:  Sidra Koyanagi, PA-C (present and scrubbed throughout the case, critical for assistance with exposure, retraction, instrumentation, and closure)   ANESTHESIA: spinal   ESTIMATED BLOOD LOSS: 100 mL   FLUIDS REPLACED: 900 mL of crystalloid   DRAINS: 2 medium hemovac drains   IMPLANTS UTILIZED: DePuy size 4 high offset Actis femoral stem, 52 mm OD Pinnacle 100 acetabular component, +4 mm neutral Pinnacle Altrx polyethylene insert, and a 36 mm M-SPEC +1.5 mm hip ball  Treatments:  none  Discharge Exam: Blood pressure (!) (P) 110/53, pulse (P) 61, temperature 98.3 F (36.8 C), temperature source Oral, resp. rate (P) 16, height 5' 6 (1.676 m), weight 54.1 kg, SpO2 (P) 99%.   Disposition: home   Allergies as of 05/07/2024       Reactions   Nitrofurantoin Macrocrystal Hives        Medication List     TAKE these medications    aspirin  81 MG chewable tablet Chew 1 tablet (81 mg total) by mouth 2 (two) times daily.   CALCIUM 1200+D3 PO Take 1 capsule by mouth daily.   celecoxib  200 MG capsule Commonly known as: CELEBREX  Take 1 capsule (200 mg total) by mouth 2 (two) times daily.   cyanocobalamin  1000 MCG tablet Commonly known as: VITAMIN B12 Take 1,000 mcg by mouth daily.   escitalopram  10 MG tablet Commonly known as: LEXAPRO  Take 10 mg by mouth at bedtime.   latanoprost  0.005 % ophthalmic solution Commonly known as: XALATAN  Place 1 drop into both eyes at bedtime.   Multi-Vitamins Tabs Take 1 tablet by mouth daily. Men's Multivitamin   Omega 3 1000 MG Caps Take 1 capsule by mouth daily.   oxyCODONE  5 MG immediate release tablet Commonly known as: Oxy IR/ROXICODONE  Take 1 tablet (5 mg total) by mouth every 4 (four) hours as needed for moderate pain (pain score 4-6) (pain score 4-6).   polyethylene glycol 17 g packet Commonly known as: MIRALAX  / GLYCOLAX  Take 17 g by mouth every morning.   PRESERVISION AREDS 2+MULTI VIT PO Take 1 tablet by mouth 2 (two) times daily.   PROBIOTIC & ACIDOPHILUS  EX ST PO Take 1 capsule by mouth every morning.   Systane Complete 0.6 % Soln Generic drug: Propylene Glycol Apply 1 drop to eye in the morning, at noon, in the evening, and at bedtime.   SYSTANE OVERNIGHT THERAPY OP Apply 1 Application to eye at bedtime. Severe Dry Eye Relief   traMADol  50 MG tablet Commonly known as: ULTRAM  Take 1-2 tablets (50-100 mg total) by mouth every 4 (four) hours as needed for moderate pain (pain score 4-6).    Turmeric Curcumin 500 MG Caps Take 1 capsule by mouth daily.   Tylenol  8 Hour Arthritis Pain 650 MG CR tablet Generic drug: acetaminophen  1,300 mg every 8 (eight) hours as needed.   Vitamin D3 125 MCG (5000 UT) Caps Take 1 capsule by mouth daily.               Durable Medical Equipment  (From admission, onward)           Start     Ordered   05/06/24 1644  DME Walker rolling  Once       Question:  Patient needs a walker to treat with the following condition  Answer:  S/P total hip arthroplasty   05/06/24 1643   05/06/24 1644  DME Bedside commode  Once       Comments: Patient is not able to walk the distance required to go the bathroom, or he/she is unable to safely negotiate stairs required to access the bathroom.  A 3in1 BSC will alleviate this problem  Question:  Patient needs a bedside commode to treat with the following condition  Answer:  S/P total hip arthroplasty   05/06/24 1643             Signed: Sidra Koyanagi 05/07/2024, 9:02 AM   Objective: Vital signs in last 24 hours: Temp:  [97.5 F (36.4 C)-99.1 F (37.3 C)] 98.3 F (36.8 C) (08/07 0405) Pulse Rate:  [55-86] (P) 61 (08/07 0743) Resp:  [12-20] (P) 16 (08/07 0743) BP: (103-153)/(54-82) (P) 110/53 (08/07 0743) SpO2:  [95 %-100 %] (P) 99 % (08/07 0743) Weight:  [54.1 kg] 54.1 kg (08/06 1108)  Intake/Output from previous day:  Intake/Output Summary (Last 24 hours) at 05/07/2024 0902 Last data filed at 05/07/2024 0424 Gross per 24 hour  Intake 2806.73 ml  Output 1090 ml  Net 1716.73 ml    Intake/Output this shift: No intake/output data recorded.  Labs: No results for input(s): HGB in the last 72 hours. No results for input(s): WBC, RBC, HCT, PLT in the last 72 hours. No results for input(s): NA, K, CL, CO2, BUN, CREATININE, GLUCOSE, CALCIUM in the last 72 hours. No results for input(s): LABPT, INR in the last 72 hours.  EXAM: General - Patient is Alert,  Appropriate, and Oriented Extremity - Neurologically intact Neurovascular intact Sensation intact distally Intact pulses distally Dorsiflexion/Plantar flexion intact No cellulitis present Compartment soft Dressing - dressing C/D/I and no drainage Motor Function - intact, moving foot and toes well on exam. JP Drain pulled without difficulty. Intact  Assessment/Plan: 1 Day Post-Op Procedure(s) (LRB): ARTHROPLASTY, HIP, TOTAL,POSTERIOR APPROACH (Right) Procedure(s) (LRB): ARTHROPLASTY, HIP, TOTAL,POSTERIOR APPROACH (Right) Past Medical History:  Diagnosis Date   Anemia    Anxiety    Cataract cortical, senile    Chronic kidney disease    Colon polyp    Constipation    Depression    DNR no code (do not resuscitate)    GERD (gastroesophageal reflux disease)    H/O hematuria  Hemochromatosis    Hyperlipidemia    since 10/2013,  Dr. Dennise has cleared her from meds and reported to her her levels are [facility].    Incontinence in female    Osteoarthritis of both hands    Osteoarthritis of right hip    Osteopenia    Osteoporosis    Overactive bladder    Stress incontinence    Vaginal atrophy    Wears hearing aid in both ears    Principal Problem:   Hx of total hip arthroplasty, right  Estimated body mass index is 19.24 kg/m as calculated from the following:   Height as of this encounter: 5' 6 (1.676 m).   Weight as of this encounter: 54.1 kg.   Patient will continue to work with physical therapy to pass postoperative PT protocols, ROM and strengthening   Hip Preacutions   Discussed with the patient continuing to utilize ice over the bandage   Patient will wear TED hose bilaterally to help prevent DVT and clot formation   Discussed the Aquacel bandage.  This bandage will stay in place 7 days postoperatively.  Can be replaced with honeycomb bandages that will be sent home with the patient   Discussed sending the patient home with tramadol  and oxycodone  for as needed pain  management.  Patient will also be sent home with Celebrex  to help with swelling and inflammation.  Patient will take an 81 mg aspirin  twice daily for DVT prophylaxis   JP drain removed without difficulty, intact   Weight-Bearing as tolerated to right leg   Patient will follow-up with [facility] clinic orthopedics in 6 weeks for re-imaging and reevaluation  Diet - Regular diet Follow up - in 6 weeks Activity - WBAT Disposition - Home Condition Upon Discharge - Good DVT Prophylaxis - TED hose and ASA  Judith Spranger E. Tani Virgo, PA-C Orthopaedic Surgery 05/07/2024, 9:02 AM

## 2024-05-07 NOTE — Evaluation (Signed)
 Occupational Therapy Evaluation Patient Details Name: Judith Kim MRN: [id number] DOB: [birth year] Today's Date: 05/07/2024   History of Present Illness   Pt is a 88 year old female s/p R THA on 05/06/24     Clinical Impressions Pt seen for OT evaluation this date, POD#1 from above surgery. Pt was MOD I-I in ADL/IADL prior to surgery, amb with no AD but limited recently due to pain. . Pt able to recall 3/3 posterior total hip precautions at start of session and unable to verbalize how to implement during ADL and mobility. Pt instructed in posterior total hip precautions and how to implement, self care skills, falls prevention strategies, home/routines modifications, DME/AE for LB bathing and dressing tasks. MIN A required for LB dressing, STS with CGA, amb with RW with supervision-CGA including toilet transfer and additional 20' with intermittent vcs for technique. Pt would benefit from additional instruction in self care skills and techniques to help maintain precautions with or without assistive devices to support recall and carryover prior to discharge. OT will follow to facilitate optimal ADL/functional mobility performance. Pt is left in care of PT, all needs met.      If plan is discharge home, recommend the following:   A little help with walking and/or transfers;A little help with bathing/dressing/bathroom;Help with stairs or ramp for entrance;Assistance with cooking/housework     Functional Status Assessment   Patient has had a recent decline in their functional status and demonstrates the ability to make significant improvements in function in a reasonable and predictable amount of time.     Equipment Recommendations   BSC/3in1;Other (comment) (2WW)     Recommendations for Other Services         Precautions/Restrictions   Precautions Precautions: Fall;Posterior Hip Recall of Precautions/Restrictions: Intact Restrictions Weight Bearing Restrictions Per  Provider Order: Yes RLE Weight Bearing Per Provider Order: Weight bearing as tolerated     Mobility Bed Mobility               General bed mobility comments: NT in recliner pre/post session    Transfers Overall transfer level: Needs assistance Equipment used: Rolling walker (2 wheels) Transfers: Sit to/from Stand Sit to Stand: Contact guard assist                  Balance Overall balance assessment: Needs assistance Sitting-balance support: Feet supported Sitting balance-Leahy Scale: Good     Standing balance support: Bilateral upper extremity supported, During functional activity, Reliant on assistive device for balance Standing balance-Leahy Scale: Fair                             ADL either performed or assessed with clinical judgement   ADL Overall ADL's : Needs assistance/impaired Eating/Feeding: Set up   Grooming: Supervision/safety;Standing;Wash/dry hands Grooming Details (indicate cue type and reason): with RW at sink level             Lower Body Dressing: Minimal assistance;Adhering to hip precautions Lower Body Dressing Details (indicate cue type and reason): education re: use of AE for precautions, donning underwear and pants Toilet Transfer: Contact guard assist;Rolling walker (2 wheels);Ambulation;BSC/3in1;Regular Toilet   Toileting- Clothing Manipulation and Hygiene: Supervision/safety;Sitting/lateral lean       Functional mobility during ADLs: Contact guard assist;Rolling walker (2 wheels) (approx 20' with RW, intermittent vcs for technique)       Vision Patient Visual Report: No change from baseline  Perception         Praxis         Pertinent Vitals/Pain Pain Assessment Pain Assessment: 0-10 Pain Score: 1  Pain Location: R hip Pain Descriptors / Indicators: Discomfort, Sore Pain Intervention(s): Monitored during session, Repositioned, Ice applied     Extremity/Trunk Assessment Upper Extremity  Assessment Upper Extremity Assessment: Overall WFL for tasks assessed   Lower Extremity Assessment Lower Extremity Assessment: Defer to PT evaluation       Communication Communication Communication: No apparent difficulties   Cognition Arousal: Alert Behavior During Therapy: WFL for tasks assessed/performed Cognition: No apparent impairments                               Following commands: Intact       Cueing  General Comments   Cueing Techniques: Verbal cues  vss throughout, dressing intact pre/post session   Exercises     Shoulder Instructions      Home Living Family/patient expects to be discharged to:: Private residence [facility]) Living Arrangements: Alone Available Help at Discharge: Family;Available PRN/intermittently (family plans to come stay/assist as needed) Type of Home: Apartment Home Access: Stairs to enter [employer] of Steps: 3 flights but has an Personnel officer Layout: One level     Bathroom Shower/Tub: Producer, television/film/video: Handicapped height (has riser/rails) Bathroom Accessibility: Yes   Home Equipment: Shower seat;Toilet riser          Prior Functioning/Environment Prior Level of Function : Independent/Modified Independent;Driving                    OT Problem List: Decreased strength;Impaired balance (sitting and/or standing);Decreased knowledge of precautions;Decreased activity tolerance;Decreased knowledge of use of DME or AE   OT Treatment/Interventions: Self-care/ADL training;DME and/or AE instruction;Therapeutic activities;Balance training;Energy conservation;Patient/family education;Therapeutic exercise      OT Goals(Current goals can be found in the care plan section)   Acute Rehab OT Goals Patient Stated Goal: become more independent OT Goal Formulation: With patient Time For Goal Achievement: 05/21/24 Potential to Achieve Goals: Good ADL Goals Pt Will Perform  Grooming: with modified independence;with adaptive equipment;sitting;standing Pt Will Perform Lower Body Dressing: with modified independence;sitting/lateral leans;sit to/from stand;with adaptive equipment Pt Will Transfer to Toilet: with modified independence;ambulating Pt Will Perform Toileting - Clothing Manipulation and hygiene: with modified independence;sitting/lateral leans;sit to/from stand   OT Frequency:  Min 3X/week    Co-evaluation              AM-PAC OT 6 Clicks Daily Activity     Outcome Measure Help from another person eating meals?: None Help from another person taking care of personal grooming?: None Help from another person toileting, which includes using toliet, bedpan, or urinal?: None Help from another person bathing (including washing, rinsing, drying)?: A Little Help from another person to put on and taking off regular upper body clothing?: None Help from another person to put on and taking off regular lower body clothing?: A Little 6 Click Score: 22   End of Session Equipment Utilized During Treatment: Rolling walker (2 wheels) Nurse Communication: Mobility status  Activity Tolerance: Patient tolerated treatment well Patient left: in chair;with call bell/phone within reach;with family/visitor present (hand off to PT)  OT Visit Diagnosis: Other abnormalities of gait and mobility (R26.89);Muscle weakness (generalized) (M62.81)                Time: 9069-9047  OT Time Calculation (min): 22 min Charges:  OT General Charges $OT Visit: 1 Visit OT Evaluation $OT Eval Low Complexity: 1 Low  Therisa Sheffield, OTD OTR/L  05/07/24, 10:17 AM

## 2024-05-13 DIAGNOSIS — Z96641 Presence of right artificial hip joint: Secondary | ICD-10-CM | POA: Diagnosis not present

## 2024-05-13 DIAGNOSIS — M6281 Muscle weakness (generalized): Secondary | ICD-10-CM | POA: Diagnosis not present

## 2024-05-13 DIAGNOSIS — R2689 Other abnormalities of gait and mobility: Secondary | ICD-10-CM | POA: Diagnosis not present

## 2024-06-01 DIAGNOSIS — M6281 Muscle weakness (generalized): Secondary | ICD-10-CM | POA: Diagnosis not present

## 2024-06-01 DIAGNOSIS — Z96641 Presence of right artificial hip joint: Secondary | ICD-10-CM | POA: Diagnosis not present

## 2024-06-01 DIAGNOSIS — R2689 Other abnormalities of gait and mobility: Secondary | ICD-10-CM | POA: Diagnosis not present

## 2024-06-03 ENCOUNTER — Encounter

## 2024-06-18 DIAGNOSIS — Z96641 Presence of right artificial hip joint: Secondary | ICD-10-CM | POA: Diagnosis not present

## 2024-06-22 DIAGNOSIS — Z862 Personal history of diseases of the blood and blood-forming organs and certain disorders involving the immune mechanism: Secondary | ICD-10-CM | POA: Diagnosis not present

## 2024-06-25 ENCOUNTER — Encounter: Payer: Self-pay | Admitting: Oncology

## 2024-06-29 ENCOUNTER — Inpatient Hospital Stay: Attending: Oncology

## 2024-06-29 NOTE — Progress Notes (Signed)
No phlebotomy today.

## 2024-07-08 DIAGNOSIS — M19042 Primary osteoarthritis, left hand: Secondary | ICD-10-CM | POA: Diagnosis not present

## 2024-07-08 DIAGNOSIS — Z96641 Presence of right artificial hip joint: Secondary | ICD-10-CM | POA: Diagnosis not present

## 2024-07-08 DIAGNOSIS — E871 Hypo-osmolality and hyponatremia: Secondary | ICD-10-CM | POA: Diagnosis not present

## 2024-07-08 DIAGNOSIS — I498 Other specified cardiac arrhythmias: Secondary | ICD-10-CM | POA: Diagnosis not present

## 2024-07-08 DIAGNOSIS — I700 Atherosclerosis of aorta: Secondary | ICD-10-CM | POA: Diagnosis not present

## 2024-07-08 DIAGNOSIS — M19041 Primary osteoarthritis, right hand: Secondary | ICD-10-CM | POA: Diagnosis not present

## 2024-07-13 ENCOUNTER — Encounter: Payer: Self-pay | Admitting: Oncology

## 2024-07-20 DIAGNOSIS — Z862 Personal history of diseases of the blood and blood-forming organs and certain disorders involving the immune mechanism: Secondary | ICD-10-CM | POA: Diagnosis not present

## 2024-07-21 ENCOUNTER — Encounter: Payer: Self-pay | Admitting: Oncology

## 2024-07-30 ENCOUNTER — Encounter: Payer: Self-pay | Admitting: Oncology

## 2024-07-30 ENCOUNTER — Inpatient Hospital Stay

## 2024-07-30 ENCOUNTER — Inpatient Hospital Stay: Attending: Oncology | Admitting: Oncology

## 2024-07-30 VITALS — BP 123/70 | HR 62 | Resp 18

## 2024-07-30 DIAGNOSIS — Z8052 Family history of malignant neoplasm of bladder: Secondary | ICD-10-CM | POA: Diagnosis not present

## 2024-07-30 DIAGNOSIS — Z862 Personal history of diseases of the blood and blood-forming organs and certain disorders involving the immune mechanism: Secondary | ICD-10-CM

## 2024-07-30 NOTE — Progress Notes (Signed)
 Judith Kim presents today for phlebotomy per MD orders. Phlebotomy procedure started at 1515 and ended at 1518. 200 mls removed. Patient tolerated procedure well. IV needle removed intact.

## 2024-07-30 NOTE — Patient Instructions (Signed)

## 2024-07-30 NOTE — Progress Notes (Unsigned)
 [facility]  [phone] [phone]  ID: Judith Kim Number OB: 1936-06-11  MR#: [id number]  RDW#:[id number]  Patient Care Team: Sadie Manna, MD as PCP - General (Internal Medicine) Jacobo Evalene PARAS, MD as Consulting Physician (Oncology)   CHIEF COMPLAINT: Hereditary hemochromatosis.  INTERVAL HISTORY: Patient returns to clinic today for routine 80-month evaluation and continuation of phlebotomy.  She continues to feel well and remains asymptomatic.  She does not complain of any weakness or fatigue.  She has no neurologic complaints.  She denies any recent fevers or illnesses.  She has a good appetite and denies weight loss.  She denies any chest pain, shortness of breath, cough, or hemoptysis.  She denies any nausea, vomiting, or diarrhea.  She has no urinary complaints.  Patient offers no specific complaints today.  REVIEW OF SYSTEMS:   Review of Systems  Constitutional: Negative.  Negative for fever, malaise/fatigue and weight loss.  HENT: Negative.  Negative for congestion.   Respiratory: Negative.  Negative for shortness of breath.   Cardiovascular: Negative.  Negative for chest pain and leg swelling.  Gastrointestinal: Negative.  Negative for constipation, diarrhea, nausea and vomiting.  Genitourinary: Negative.  Negative for dysuria.  Musculoskeletal: Negative.  Negative for back pain.  Skin: Negative.  Negative for rash.  Neurological: Negative.  Negative for tingling, sensory change, weakness and headaches.  Psychiatric/Behavioral: Negative.  The patient is not nervous/anxious and does not have insomnia.     As per HPI. Otherwise, a complete review of systems is negative.  PAST MEDICAL HISTORY: Past Medical History:  Diagnosis Date   Anemia    Anxiety    Cataract cortical, senile    Chronic kidney disease    Colon polyp    Constipation    Depression    DNR no code (do not resuscitate)    GERD (gastroesophageal reflux  disease)    H/O hematuria    Hemochromatosis    Hyperlipidemia    since 10/2013,  Dr. Dennise has cleared her from meds and reported to her her levels are [facility].    Incontinence in female    Osteoarthritis of both hands    Osteoarthritis of right hip    Osteopenia    Osteoporosis    Overactive bladder    Stress incontinence    Vaginal atrophy    Wears hearing aid in both ears     PAST SURGICAL HISTORY: Past Surgical History:  Procedure Laterality Date   CATARACT EXTRACTION Bilateral    COLONOSCOPY     COLONOSCOPY WITH PROPOFOL  N/A 03/28/2015   Procedure: COLONOSCOPY WITH PROPOFOL ;  Surgeon: Lamar ONEIDA Holmes, MD;  Location: [facility] ENDOSCOPY;  Service: Endoscopy;  Laterality: N/A;   ESOPHAGOGASTRODUODENOSCOPY     FLEXIBLE SIGMOIDOSCOPY     TONSILLECTOMY     age 34   TOTAL HIP ARTHROPLASTY Right 05/06/2024   Procedure: ARTHROPLASTY, HIP, TOTAL,POSTERIOR APPROACH;  Surgeon: Mardee Lynwood SQUIBB, MD;  Location: [facility] ORS;  Service: Orthopedics;  Laterality: Right;    FAMILY HISTORY Family History  Problem Relation Age of Onset   Bipolar disorder Father    Cancer Mother    Kidney disease Mother    Bladder Cancer Mother    Breast cancer Neg Hx        ADVANCED DIRECTIVES:    HEALTH MAINTENANCE: Social History   Tobacco Use   Smoking status: Never   Smokeless tobacco: Never  Vaping Use   Vaping status: Never Used  Substance Use Topics   Alcohol   use: Not Currently    Alcohol /week: 1.0 standard drink of alcohol     Types: 1 Glasses of wine per week   Drug use: No     Colonoscopy:  PAP:  Bone density:  Lipid panel:  Allergies  Allergen Reactions   Nitrofurantoin Macrocrystal Hives    Current Outpatient Medications  Medication Sig Dispense Refill   acetaminophen  (TYLENOL  8 HOUR ARTHRITIS PAIN) 650 MG CR tablet 1,300 mg every 8 (eight) hours as needed.     Calcium-Magnesium -Vitamin D (CALCIUM 1200+D3 PO) Take 1 capsule by mouth daily.     Cholecalciferol (VITAMIN D3) 5000  UNITS CAPS Take 1 capsule by mouth daily.     cyanocobalamin  (VITAMIN B12) 1000 MCG tablet Take 1,000 mcg by mouth daily.     escitalopram  (LEXAPRO ) 10 MG tablet Take 10 mg by mouth at bedtime.     Flaxseed, Linseed, (FLAX SEED OIL PO) Take by mouth.     Hypromellose (SYSTANE OVERNIGHT THERAPY OP) Apply 1 Application to eye at bedtime. Severe Dry Eye Relief     latanoprost  (XALATAN ) 0.005 % ophthalmic solution Place 1 drop into both eyes at bedtime.     Multiple Vitamin (MULTI-VITAMINS) TABS Take 1 tablet by mouth daily. Men's Multivitamin     Multiple Vitamins-Minerals (PRESERVISION AREDS 2+MULTI VIT PO) Take 1 tablet by mouth 2 (two) times daily.     Omega 3 1000 MG CAPS Take 1 capsule by mouth daily.     polyethylene glycol (MIRALAX  / GLYCOLAX ) 17 g packet Take 17 g by mouth every morning.     Probiotic Product (PROBIOTIC & ACIDOPHILUS EX ST PO) Take 1 capsule by mouth every morning.     Propylene Glycol (SYSTANE COMPLETE) 0.6 % SOLN Apply 1 drop to eye in the morning, at noon, in the evening, and at bedtime.     Turmeric Curcumin 500 MG CAPS Take 1 capsule by mouth daily.     No current facility-administered medications for this visit.    OBJECTIVE: Vitals:   07/30/24 1439  BP: 133/73  Pulse: 62  Resp: 18  Temp: 97.9 F (36.6 C)  SpO2: 99%     Body mass index is 19.85 kg/m.    ECOG FS:0 - Asymptomatic  General: Well-developed, well-nourished, no acute distress. Eyes: Pink conjunctiva, anicteric sclera. HEENT: Normocephalic, moist mucous membranes. Lungs: No audible wheezing or coughing. Heart: Regular rate and rhythm. Abdomen: Soft, nontender, no obvious distention. Musculoskeletal: No edema, cyanosis, or clubbing. Neuro: Alert, answering all questions appropriately. Cranial nerves grossly intact. Skin: No rashes or petechiae noted. Psych: Normal affect.  LAB RESULTS:  Lab Results  Component Value Date   NA 135 04/30/2024   K 4.1 04/30/2024   CL 96 (L) 04/30/2024    CO2 29 04/30/2024   GLUCOSE 90 04/30/2024   BUN 18 04/30/2024   CREATININE 0.55 04/30/2024   CALCIUM 9.3 04/30/2024   PROT 7.0 06/14/2014   ALBUMIN 4.0 06/14/2014   AST 17 06/14/2014   ALT 20 06/14/2014   ALKPHOS 83 06/14/2014   BILITOT 0.4 06/14/2014   GFRNONAA >60 04/30/2024    Lab Results  Component Value Date   WBC 4.5 02/19/2018   NEUTROABS 2.8 02/19/2018   HGB 12.6 02/19/2018   HCT 36.6 02/19/2018   MCV 95.8 02/19/2018   PLT 336 02/19/2018     STUDIES: No results found.  ASSESSMENT: Hereditary hemochromatosis, by report heterozygote.  PLAN:    Hereditary hemochromatosis: Patient now receives her laboratory work at outside facility.  Her  most recent laboratory work revealed a hemoglobin of 13.1, ferritin of 61, total iron of 128, and a percent saturation of 52%.  These remain essentially stable.  Continue 200 mL phlebotomy every 2 months.  Proceed with treatment today.  Return to clinic in 2 and 4 months for treatment only.  Patient will then return to clinic in 6 months for further evaluation and continuation of treatment.  I spent a total of 20 minutes reviewing chart data, face-to-face evaluation with the patient, counseling and coordination of care as detailed above.  Patient expressed understanding and was in agreement with this plan. She also understands that She can call clinic at any time with any questions, concerns, or complaints.    Evalene JINNY Reusing, MD 07/30/24 2:54 PM

## 2024-07-30 NOTE — Progress Notes (Unsigned)
 Patient has been doing ok.

## 2024-07-31 ENCOUNTER — Encounter: Payer: Self-pay | Admitting: Oncology

## 2024-08-10 DIAGNOSIS — H401122 Primary open-angle glaucoma, left eye, moderate stage: Secondary | ICD-10-CM | POA: Diagnosis not present

## 2024-08-10 DIAGNOSIS — H401111 Primary open-angle glaucoma, right eye, mild stage: Secondary | ICD-10-CM | POA: Diagnosis not present

## 2024-08-12 DIAGNOSIS — H35372 Puckering of macula, left eye: Secondary | ICD-10-CM | POA: Diagnosis not present

## 2024-08-12 DIAGNOSIS — N3946 Mixed incontinence: Secondary | ICD-10-CM | POA: Diagnosis not present

## 2024-08-12 DIAGNOSIS — F419 Anxiety disorder, unspecified: Secondary | ICD-10-CM | POA: Diagnosis not present

## 2024-08-12 DIAGNOSIS — F330 Major depressive disorder, recurrent, mild: Secondary | ICD-10-CM | POA: Diagnosis not present

## 2024-08-12 DIAGNOSIS — E871 Hypo-osmolality and hyponatremia: Secondary | ICD-10-CM | POA: Diagnosis not present

## 2024-08-12 DIAGNOSIS — I2510 Atherosclerotic heart disease of native coronary artery without angina pectoris: Secondary | ICD-10-CM | POA: Diagnosis not present

## 2024-08-12 DIAGNOSIS — K5909 Other constipation: Secondary | ICD-10-CM | POA: Diagnosis not present

## 2024-08-19 DIAGNOSIS — H401122 Primary open-angle glaucoma, left eye, moderate stage: Secondary | ICD-10-CM | POA: Diagnosis not present

## 2024-08-19 DIAGNOSIS — H401111 Primary open-angle glaucoma, right eye, mild stage: Secondary | ICD-10-CM | POA: Diagnosis not present

## 2024-08-19 DIAGNOSIS — Z961 Presence of intraocular lens: Secondary | ICD-10-CM | POA: Diagnosis not present

## 2024-08-19 DIAGNOSIS — H1013 Acute atopic conjunctivitis, bilateral: Secondary | ICD-10-CM | POA: Diagnosis not present

## 2024-09-16 ENCOUNTER — Encounter: Payer: Self-pay | Admitting: Oncology

## 2024-09-29 ENCOUNTER — Encounter: Payer: Self-pay | Admitting: Oncology

## 2024-09-29 ENCOUNTER — Inpatient Hospital Stay: Attending: Oncology

## 2024-09-29 DIAGNOSIS — Z862 Personal history of diseases of the blood and blood-forming organs and certain disorders involving the immune mechanism: Secondary | ICD-10-CM

## 2024-09-29 NOTE — Progress Notes (Signed)
No phlebotomy today.

## 2024-11-26 ENCOUNTER — Inpatient Hospital Stay

## 2025-01-28 ENCOUNTER — Inpatient Hospital Stay: Admitting: Oncology

## 2025-01-28 ENCOUNTER — Inpatient Hospital Stay
